# Patient Record
Sex: Female | Born: 1945 | Race: White | Hispanic: No | Smoking: Never smoker
Health system: Southern US, Community
[De-identification: ages and names within clinical notes are randomized; demographics above are authoritative.]

## PROBLEM LIST (undated history)

## (undated) DIAGNOSIS — R0989 Other specified symptoms and signs involving the circulatory and respiratory systems: Secondary | ICD-10-CM

## (undated) DIAGNOSIS — E7800 Pure hypercholesterolemia, unspecified: Secondary | ICD-10-CM

## (undated) DIAGNOSIS — M810 Age-related osteoporosis without current pathological fracture: Secondary | ICD-10-CM

## (undated) DIAGNOSIS — I10 Essential (primary) hypertension: Secondary | ICD-10-CM

## (undated) HISTORY — PX: OTHER SURGICAL HISTORY: SHX169

## (undated) HISTORY — DX: Pure hypercholesterolemia, unspecified: E78.00

## (undated) HISTORY — PX: TUBAL LIGATION: SHX77

---

## 2007-02-09 ENCOUNTER — Emergency Department (HOSPITAL_COMMUNITY): Admission: EM | Admit: 2007-02-09 | Discharge: 2007-02-09 | Payer: Self-pay | Admitting: Family Medicine

## 2011-07-07 DIAGNOSIS — E785 Hyperlipidemia, unspecified: Secondary | ICD-10-CM | POA: Insufficient documentation

## 2011-07-07 DIAGNOSIS — Z77120 Contact with and (suspected) exposure to mold (toxic): Secondary | ICD-10-CM | POA: Insufficient documentation

## 2011-11-09 DIAGNOSIS — R21 Rash and other nonspecific skin eruption: Secondary | ICD-10-CM | POA: Insufficient documentation

## 2011-11-09 DIAGNOSIS — R591 Generalized enlarged lymph nodes: Secondary | ICD-10-CM | POA: Insufficient documentation

## 2011-11-09 DIAGNOSIS — R040 Epistaxis: Secondary | ICD-10-CM | POA: Insufficient documentation

## 2012-04-22 ENCOUNTER — Encounter (HOSPITAL_COMMUNITY): Payer: Self-pay | Admitting: Emergency Medicine

## 2012-04-22 ENCOUNTER — Emergency Department (HOSPITAL_COMMUNITY)
Admission: EM | Admit: 2012-04-22 | Discharge: 2012-04-23 | Disposition: A | Discharge status: Home / Self Care | Payer: Medicare Other | Attending: Emergency Medicine | Admitting: Emergency Medicine

## 2012-04-22 DIAGNOSIS — Z7982 Long term (current) use of aspirin: Secondary | ICD-10-CM | POA: Insufficient documentation

## 2012-04-22 DIAGNOSIS — M542 Cervicalgia: Secondary | ICD-10-CM | POA: Insufficient documentation

## 2012-04-22 DIAGNOSIS — R220 Localized swelling, mass and lump, head: Secondary | ICD-10-CM | POA: Insufficient documentation

## 2012-04-22 DIAGNOSIS — Z79899 Other long term (current) drug therapy: Secondary | ICD-10-CM | POA: Insufficient documentation

## 2012-04-22 DIAGNOSIS — R062 Wheezing: Secondary | ICD-10-CM | POA: Insufficient documentation

## 2012-04-22 DIAGNOSIS — I10 Essential (primary) hypertension: Secondary | ICD-10-CM | POA: Insufficient documentation

## 2012-04-22 DIAGNOSIS — M810 Age-related osteoporosis without current pathological fracture: Secondary | ICD-10-CM | POA: Insufficient documentation

## 2012-04-22 DIAGNOSIS — E7800 Pure hypercholesterolemia, unspecified: Secondary | ICD-10-CM | POA: Insufficient documentation

## 2012-04-22 DIAGNOSIS — R0989 Other specified symptoms and signs involving the circulatory and respiratory systems: Secondary | ICD-10-CM | POA: Insufficient documentation

## 2012-04-22 HISTORY — DX: Age-related osteoporosis without current pathological fracture: M81.0

## 2012-04-22 HISTORY — DX: Essential (primary) hypertension: I10

## 2012-04-22 NOTE — ED Notes (Signed)
C/o distended L jugular vein x 1 week.  Reports L side of neck sore x 3 months.

## 2012-04-23 ENCOUNTER — Emergency Department (HOSPITAL_COMMUNITY): Payer: Medicare Other

## 2012-04-23 ENCOUNTER — Ambulatory Visit (HOSPITAL_COMMUNITY): Payer: Medicare Other

## 2012-04-23 LAB — POCT I-STAT, CHEM 8
BUN: 18 mg/dL (ref 6–23)
Hemoglobin: 13.9 g/dL (ref 12.0–15.0)
Sodium: 141 mEq/L (ref 135–145)
TCO2: 27 mmol/L (ref 0–100)

## 2012-04-23 MED ORDER — IOHEXOL 300 MG/ML  SOLN
100.0000 mL | Freq: Once | INTRAMUSCULAR | Status: AC | PRN
  Administered 2012-04-23: 100 mL via INTRAVENOUS

## 2012-04-23 NOTE — ED Notes (Signed)
Iv team here 

## 2012-04-23 NOTE — ED Provider Notes (Signed)
History     CSN: [id number]  Arrival date & time 04/22/12  2246   First MD Initiated Contact with Patient 04/23/12 0024      Chief Complaint  Patient presents with  . Neck Pain   HPI  History provided by the patient. Patient is a 66 year old female with history of hypertension, hypercholesterolemia and osteoporosis who presents with concerns for distended left jugular vein that began this past Monday he has been persistent for the past week. Patient states that she had never noticed this before but has had some recent history of pressure and tightness in the left side of her neck. She also reports recent history of lymphadenopathy of the neck that resolved over the past month. Patient states that during that time she was exposed to a toxic mold in her home and was seen by a lung specialist at [facility]. She denied any testing performed as she was asymptomatic at that time but was noted to have lymphadenopathy of the neck. Patient does report occasionally having some wheezing of her chest but denies any shortness of breath symptoms. Patient denies any other associated symptoms. She denies any severe neck pain this time. She denies any difficulties breathing or swallowing. Denies any strokelike symptoms. No headache, confusion, facial weakness, vision change, speech difficulty, weakness or numbness in extremities.    Past Medical History  Diagnosis Date  . Hypertension   . High cholesterol   . Osteoporosis     History reviewed. No pertinent past surgical history.  No family history on file.  History  Substance Use Topics  . Smoking status: Never Smoker   . Smokeless tobacco: Not on file  . Alcohol Use: Yes    OB History    Grav Para Term Preterm Abortions TAB SAB Ect Mult Living                  Review of Systems  Constitutional: Negative for fever and chills.  HENT: Positive for neck pain.   Eyes: Negative for visual disturbance.  Respiratory: Positive for wheezing. Negative  for cough and shortness of breath.   Cardiovascular: Negative for chest pain and leg swelling.  Musculoskeletal: Negative for back pain.  Neurological: Negative for dizziness, syncope, light-headedness and headaches.    Allergies  Review of patient's allergies indicates no known allergies.  Home Medications   Current Outpatient Rx  Name Route Sig Dispense Refill  . ASPIRIN EC 81 MG PO TBEC Oral Take 81 mg by mouth daily.    . ATORVASTATIN CALCIUM 10 MG PO TABS Oral Take 10 mg by mouth daily.    Marland Kitchen CALCIUM + D PO Oral Take 1 tablet by mouth daily.    . DESONIDE 0.05 % EX CREA Topical Apply 1 application topically daily.    Marland Kitchen DIPHENHYDRAMINE HCL 25 MG PO TABS Oral Take 25 mg by mouth at bedtime.    . OMEGA-3 FATTY ACIDS 1000 MG PO CAPS Oral Take 1 g by mouth daily.    Marland Kitchen LISINOPRIL-HYDROCHLOROTHIAZIDE 20-12.5 MG PO TABS Oral Take 1 tablet by mouth daily.    . ADULT MULTIVITAMIN W/MINERALS CH Oral Take 1 tablet by mouth daily.    Marland Kitchen OVER THE COUNTER MEDICATION Both Eyes Place 1 application into both eyes daily as needed. Rewetting eye drops as needed for dry eyes    . TACROLIMUS 0.1 % EX OINT Topical Apply 1 application topically 2 (two) times a week.      BP 153/100  Temp 98.3 F (36.8  C) (Oral)  Resp 18  SpO2 97%  Physical Exam  Nursing note and vitals reviewed. Constitutional: She is oriented to person, place, and time. She appears well-developed and well-nourished. No distress.  HENT:  Head: Normocephalic and atraumatic.  Eyes: Conjunctivae and EOM are normal. Pupils are equal, round, and reactive to light.  Neck: Carotid bruit is not present. No tracheal deviation present.       Left jugular vein distention. Very mild tenderness. No masses.  Cardiovascular: Normal rate and regular rhythm.   Pulmonary/Chest: Effort normal and breath sounds normal. No stridor. No respiratory distress. She has no wheezes. She has no rales.  Abdominal: Soft. There is no tenderness.    Musculoskeletal: Normal range of motion. She exhibits no edema and no tenderness.  Lymphadenopathy:    She has no cervical adenopathy.  Neurological: She is alert and oriented to person, place, and time. She has normal strength. No cranial nerve deficit or sensory deficit. Gait normal.  Skin: Skin is warm and dry.  Psychiatric: She has a normal mood and affect. Her behavior is normal.    ED Course  Procedures   Dg Chest 2 View  04/23/2012  *RADIOLOGY REPORT*  Clinical Data: 66 year old female with left-sided swelling.  CHEST - 2 VIEW  Comparison: None  Findings:  Upper limits normal heart size noted. There is no evidence of focal airspace disease, pulmonary edema, suspicious pulmonary nodule/mass, pleural effusion, or pneumothorax. No acute bony abnormalities are identified.  IMPRESSION: No evidence of active cardiopulmonary disease.  Original Report Authenticated By: Rosendo Gros, M.D.   Ct Soft Tissue Neck W Contrast  04/23/2012  *RADIOLOGY REPORT*  Clinical Data:  Distended left jugular vein for 1 week.  Soreness for 3 months.  CT NECK AND CHEST WITH CONTRAST  Technique:  Multidetector CT imaging of the neck and chest was performed using the standard protocol after bolus administration of intravenous contrast.  Contrast: OMNIPAQUE IOHEXOL 300 MG/ML  SOLN  Comparison:   None.  CT NECK  Findings:  Mucosal line prevertebral spaces appear intact. Cervical fat planes are not displaced.  Carotid and jugular vessels appear patent.  Asymmetry of the external jugular veins, with left larger than right.  No evidence of venous thrombosis.  Salivary glands are symmetrical.  No significant lymphadenopathy in the neck.  Cervical lymph nodes are not pathologically enlarged. Larynx appears intact.  No abnormal mass or fluid collection in the neck.  IMPRESSION: Asymmetric increased diameter of the left external jugular vein with respect to the right side.  No venous thrombosis or mass lesion demonstrated.   No significant lymphadenopathy.  CT CHEST  Findings: Normal heart size.  Normal caliber thoracic aorta. Superior vena cava, subclavian and jugular veins appear patent without evidence of venous thrombosis.  No significant mass or lymphadenopathy demonstrated in the mediastinum or supraclavicular region.  The esophagus is decompressed.  There is a small esophageal hiatal hernia.  No pleural effusion.  Respiratory motion artifact limits visualization of the lungs but there is no obvious focal pulmonary consolidation or interstitial disease.  Scattered emphysematous changes are present.  No pneumothorax.  Incidental note of a cyst in the dome of the liver measuring about 1.5 cm diameter.  IMPRESSION: No mass, lymphadenopathy, or acute process demonstrated in the chest.  Original Report Authenticated By: Marlon Pel, M.D.   Ct Chest W Contrast  04/23/2012  *RADIOLOGY REPORT*  Clinical Data:  Distended left jugular vein for 1 week.  Soreness for 3 months.  CT NECK AND CHEST WITH CONTRAST  Technique:  Multidetector CT imaging of the neck and chest was performed using the standard protocol after bolus administration of intravenous contrast.  Contrast: OMNIPAQUE IOHEXOL 300 MG/ML  SOLN  Comparison:   None.  CT NECK  Findings:  Mucosal line prevertebral spaces appear intact. Cervical fat planes are not displaced.  Carotid and jugular vessels appear patent.  Asymmetry of the external jugular veins, with left larger than right.  No evidence of venous thrombosis.  Salivary glands are symmetrical.  No significant lymphadenopathy in the neck.  Cervical lymph nodes are not pathologically enlarged. Larynx appears intact.  No abnormal mass or fluid collection in the neck.  IMPRESSION: Asymmetric increased diameter of the left external jugular vein with respect to the right side.  No venous thrombosis or mass lesion demonstrated.  No significant lymphadenopathy.  CT CHEST  Findings: Normal heart size.  Normal caliber  thoracic aorta. Superior vena cava, subclavian and jugular veins appear patent without evidence of venous thrombosis.  No significant mass or lymphadenopathy demonstrated in the mediastinum or supraclavicular region.  The esophagus is decompressed.  There is a small esophageal hiatal hernia.  No pleural effusion.  Respiratory motion artifact limits visualization of the lungs but there is no obvious focal pulmonary consolidation or interstitial disease.  Scattered emphysematous changes are present.  No pneumothorax.  Incidental note of a cyst in the dome of the liver measuring about 1.5 cm diameter.  IMPRESSION: No mass, lymphadenopathy, or acute process demonstrated in the chest.  Original Report Authenticated By: Marlon Pel, M.D.     1. Jugular venous distension (JVD)       MDM  12:45AM patient seen and evaluated. Patient well-appearing in no distress.   Patient was also seen and discussed with attending physician. Will obtain CT scans of neck and chest after consulting with the radiologist to rule out any kind of mass or compression causing dilation of left JV.  CT unremarkable. Will schedule outpatient Doppler per patient. Patient advised of findings. She remained asymptomatic. she is ready to return home to     [place], [place] 04/23/12 [phone]

## 2012-04-23 NOTE — ED Notes (Signed)
One attempt to insert an a-c iv.unsuccessful.  Iv team called

## 2012-04-23 NOTE — ED Notes (Signed)
PT TRANSPORTED TO XRAY 

## 2012-04-23 NOTE — ED Notes (Signed)
To ct

## 2012-04-23 NOTE — ED Notes (Signed)
The pt has had some lt carotid motion in her lt neck since Monday and she feel like it is pulsating.  Sometimes its uncomfortable other times it is not..  No pain at present

## 2012-04-24 ENCOUNTER — Ambulatory Visit (HOSPITAL_COMMUNITY)
Admission: RE | Admit: 2012-04-24 | Discharge: 2012-04-24 | Disposition: A | Discharge status: Home / Self Care | Payer: Medicare Other | Source: Ambulatory Visit | Attending: Emergency Medicine | Admitting: Emergency Medicine

## 2012-04-24 DIAGNOSIS — M542 Cervicalgia: Secondary | ICD-10-CM | POA: Insufficient documentation

## 2012-04-24 DIAGNOSIS — R0989 Other specified symptoms and signs involving the circulatory and respiratory systems: Secondary | ICD-10-CM

## 2012-04-24 DIAGNOSIS — I6529 Occlusion and stenosis of unspecified carotid artery: Secondary | ICD-10-CM | POA: Insufficient documentation

## 2012-04-24 NOTE — ED Provider Notes (Signed)
Medical screening examination/treatment/procedure(s) were conducted as a shared visit with non-physician practitioner(s) and myself.  I personally evaluated the patient during the encounter  Cyndra Numbers, MD 04/24/12 1454

## 2012-04-24 NOTE — Progress Notes (Signed)
VASCULAR LAB PRELIMINARY  PRELIMINARY  PRELIMINARY  PRELIMINARY  Carotid Dopplers completed.    Preliminary report:  No ICA stenosis.  Vertebral artery flow is antegrade.  Kendra Blackburn, 04/24/2012, 12:32 PM

## 2012-04-25 DIAGNOSIS — N644 Mastodynia: Secondary | ICD-10-CM | POA: Insufficient documentation

## 2012-04-29 DIAGNOSIS — I10 Essential (primary) hypertension: Secondary | ICD-10-CM | POA: Insufficient documentation

## 2012-05-27 DIAGNOSIS — I878 Other specified disorders of veins: Secondary | ICD-10-CM | POA: Insufficient documentation

## 2012-07-05 DIAGNOSIS — R12 Heartburn: Secondary | ICD-10-CM | POA: Insufficient documentation

## 2012-07-05 DIAGNOSIS — L309 Dermatitis, unspecified: Secondary | ICD-10-CM | POA: Insufficient documentation

## 2012-07-11 ENCOUNTER — Encounter (HOSPITAL_COMMUNITY): Payer: Self-pay | Admitting: *Deleted

## 2012-07-11 ENCOUNTER — Emergency Department (HOSPITAL_COMMUNITY)
Admission: EM | Admit: 2012-07-11 | Discharge: 2012-07-11 | Disposition: A | Discharge status: Home / Self Care | Payer: Medicare Other | Source: Home / Self Care | Attending: Family Medicine | Admitting: Family Medicine

## 2012-07-11 ENCOUNTER — Emergency Department (INDEPENDENT_AMBULATORY_CARE_PROVIDER_SITE_OTHER): Payer: Medicare Other

## 2012-07-11 DIAGNOSIS — S52599A Other fractures of lower end of unspecified radius, initial encounter for closed fracture: Secondary | ICD-10-CM

## 2012-07-11 DIAGNOSIS — S52509A Unspecified fracture of the lower end of unspecified radius, initial encounter for closed fracture: Secondary | ICD-10-CM

## 2012-07-11 HISTORY — DX: Other specified symptoms and signs involving the circulatory and respiratory systems: R09.89

## 2012-07-11 HISTORY — DX: Pure hypercholesterolemia, unspecified: E78.00

## 2012-07-11 MED ORDER — HYDROCODONE-ACETAMINOPHEN 5-325 MG PO TABS: 1.0000 | ORAL_TABLET | Freq: Four times a day (QID) | ORAL | Status: DC | PRN

## 2012-07-11 NOTE — ED Notes (Signed)
Reports starting to stand up while washing windows and lost balance, falling backward onto LUE.  C/O left wrist pain with slight numbness/tingling in all digits; all digits warm, pink, with prompt cap refill.  Has been applying ice.

## 2012-07-11 NOTE — ED Provider Notes (Signed)
History     CSN: [id number]  Arrival date & time 07/11/12  1839   First MD Initiated Contact with Patient 07/11/12 1904      Chief Complaint  Patient presents with  . Wrist Injury    (Consider location/radiation/quality/duration/timing/severity/associated sxs/prior treatment) Patient is a 66 y.o. female presenting with wrist injury. The history is provided by the patient.  Wrist Injury  The incident occurred 1 to 2 hours ago. The incident occurred at home. The injury mechanism was a fall. The pain is present in the left wrist. The quality of the pain is described as aching. The pain is mild. Associated symptoms comments: H/o osteoporosis. She reports no foreign bodies present. The symptoms are aggravated by movement, use and palpation.    Past Medical History  Diagnosis Date  . Hypertension   . Osteoporosis   . JVD (jugular venous distension)     being worked up at [facility] for JVD with neck and shoulder pain  . Hypercholesteremia     Past Surgical History  Procedure Date  . Tubal ligation     No family history on file.  History  Substance Use Topics  . Smoking status: Never Smoker   . Smokeless tobacco: Not on file  . Alcohol Use: Yes     occasional    OB History    Grav Para Term Preterm Abortions TAB SAB Ect Mult Living                  Review of Systems  Constitutional: Negative.   Musculoskeletal: Positive for joint swelling.  Skin: Negative.     Allergies  Review of patient's allergies indicates no known allergies.  Home Medications   Current Outpatient Rx  Name Route Sig Dispense Refill  . ASPIRIN EC 81 MG PO TBEC Oral Take 81 mg by mouth daily.    . ATORVASTATIN CALCIUM 10 MG PO TABS Oral Take 10 mg by mouth daily.    Marland Kitchen CALCIUM + D PO Oral Take 1 tablet by mouth daily.    . OMEGA-3 FATTY ACIDS 1000 MG PO CAPS Oral Take 1 g by mouth daily.    Marland Kitchen LISINOPRIL-HYDROCHLOROTHIAZIDE 20-12.5 MG PO TABS Oral Take 1 tablet by mouth daily.    . ADULT  MULTIVITAMIN W/MINERALS CH Oral Take 1 tablet by mouth daily.    . DESONIDE 0.05 % EX CREA Topical Apply 1 application topically daily.    Marland Kitchen DIPHENHYDRAMINE HCL 25 MG PO TABS Oral Take 25 mg by mouth at bedtime.    Marland Kitchen HYDROCODONE-ACETAMINOPHEN 5-325 MG PO TABS Oral Take 1 tablet by mouth every 6 (six) hours as needed for pain. 10 tablet 0  . OVER THE COUNTER MEDICATION Both Eyes Place 1 application into both eyes daily as needed. Rewetting eye drops as needed for dry eyes    . TACROLIMUS 0.1 % EX OINT Topical Apply 1 application topically 2 (two) times a week.      BP 121/62  Pulse 59  Temp 98.9 F (37.2 C) (Oral)  Resp 18  SpO2 96%  Physical Exam  Nursing note and vitals reviewed. Constitutional: She is oriented to person, place, and time. She appears well-developed and well-nourished.  Musculoskeletal: She exhibits tenderness.       Arms: Neurological: She is alert and oriented to person, place, and time.  Skin: Skin is warm and dry.    ED Course  Procedures (including critical care time)  Labs Reviewed - No data to display Dg Wrist Complete  Left  07/11/2012  *RADIOLOGY REPORT*  Clinical Data: Wrist injury after falling today.  LEFT WRIST - COMPLETE 3+ VIEW  Comparison: None.  Findings: There is an intra-articular fracture of the dorsal lip of the distal radius which is best on the lateral view.  This is mildly displaced.  The distal ulna appears intact.  There is no dislocation.  IMPRESSION: Mildly displaced intra-articular fracture involving the dorsal lip of the distal radius.   Original Report Authenticated By: Gerrianne Scale, M.D.      1. Fracture, radius, distal       MDM  X-rays reviewed and report per radiologist.         Linna Hoff, MD 07/11/12 2036

## 2012-07-11 NOTE — Progress Notes (Signed)
Orthopedic Tech Progress Note Patient Details:  Kendra Blackburn 07-11-1946 [id number]  Ortho Devices Type of Ortho Device: Sugartong splint;Arm foam sling Ortho Device/Splint Location: left arm Ortho Device/Splint Interventions: Application   Kendra Blackburn 07/11/2012, 8:53 PM

## 2012-07-29 DIAGNOSIS — R519 Headache, unspecified: Secondary | ICD-10-CM | POA: Insufficient documentation

## 2012-07-29 DIAGNOSIS — R51 Headache: Secondary | ICD-10-CM

## 2012-11-30 DIAGNOSIS — Z961 Presence of intraocular lens: Secondary | ICD-10-CM | POA: Insufficient documentation

## 2013-02-24 DIAGNOSIS — M8580 Other specified disorders of bone density and structure, unspecified site: Secondary | ICD-10-CM | POA: Insufficient documentation

## 2013-02-28 DIAGNOSIS — H02829 Cysts of unspecified eye, unspecified eyelid: Secondary | ICD-10-CM | POA: Insufficient documentation

## 2013-05-04 DIAGNOSIS — Z9889 Other specified postprocedural states: Secondary | ICD-10-CM | POA: Insufficient documentation

## 2014-03-05 ENCOUNTER — Emergency Department (INDEPENDENT_AMBULATORY_CARE_PROVIDER_SITE_OTHER)
Admission: EM | Admit: 2014-03-05 | Discharge: 2014-03-05 | Disposition: A | Discharge status: Home / Self Care | Payer: Medicare Other | Source: Home / Self Care | Attending: Emergency Medicine | Admitting: Emergency Medicine

## 2014-03-05 ENCOUNTER — Encounter (HOSPITAL_COMMUNITY): Payer: Self-pay | Admitting: Emergency Medicine

## 2014-03-05 DIAGNOSIS — L247 Irritant contact dermatitis due to plants, except food: Secondary | ICD-10-CM

## 2014-03-05 DIAGNOSIS — L255 Unspecified contact dermatitis due to plants, except food: Secondary | ICD-10-CM

## 2014-03-05 MED ORDER — RANITIDINE HCL 300 MG PO TABS: 300.0000 mg | ORAL_TABLET | Freq: Every day | ORAL | Status: DC

## 2014-03-05 MED ORDER — FEXOFENADINE HCL 180 MG PO TABS: 180.0000 mg | ORAL_TABLET | Freq: Every day | ORAL | Status: DC

## 2014-03-05 MED ORDER — PREDNISONE 20 MG PO TABS
ORAL_TABLET | ORAL | Status: AC
  Filled 2014-03-05: qty 3

## 2014-03-05 MED ORDER — PREDNISONE 20 MG PO TABS
60.0000 mg | ORAL_TABLET | Freq: Once | ORAL | Status: AC
  Administered 2014-03-05: 60 mg via ORAL

## 2014-03-05 MED ORDER — PREDNISONE 10 MG PO TABS: ORAL_TABLET | ORAL | Status: DC

## 2014-03-05 NOTE — Discharge Instructions (Signed)
Poison [place] Poison ivy is a inflammation of the skin (contact dermatitis) caused by touching the allergens on the leaves of the ivy plant following previous exposure to the plant. The rash usually appears 48 hours after exposure. The rash is usually bumps (papules) or blisters (vesicles) in a linear pattern. Depending on your own sensitivity, the rash may simply cause redness and itching, or it may also progress to blisters which may break open. These must be well cared for to prevent secondary bacterial (germ) infection, followed by scarring. Keep any open areas dry, clean, dressed, and covered with an antibacterial ointment if needed. The eyes may also get puffy. The puffiness is worst in the morning and gets better as the day progresses. This dermatitis usually heals without scarring, within 2 to 3 weeks without treatment. HOME CARE INSTRUCTIONS  Thoroughly wash with soap and water as soon as you have been exposed to poison ivy. You have about one half hour to remove the plant resin before it will cause the rash. This washing will destroy the oil or antigen on the skin that is causing, or will cause, the rash. Be sure to wash under your fingernails as any plant resin there will continue to spread the rash. Do not rub skin vigorously when washing affected area. Poison ivy cannot spread if no oil from the plant remains on your body. A rash that has progressed to weeping sores will not spread the rash unless you have not washed thoroughly. It is also important to wash any clothes you have been wearing as these may carry active allergens. The rash will return if you wear the unwashed clothing, even several days later. Avoidance of the plant in the future is the best measure. Poison ivy plant can be recognized by the number of leaves. Generally, poison ivy has three leaves with flowering branches on a single stem. Diphenhydramine may be purchased over the counter and used as needed for itching. Do not drive with  this medication if it makes you drowsy.Ask your caregiver about medication for children. SEEK MEDICAL CARE IF:  Open sores develop.  Redness spreads beyond area of rash.  You notice purulent (pus-like) discharge.  You have increased pain.  Other signs of infection develop (such as fever). Document Released: 09/04/2000 Document Revised: 11/30/2011 Document Reviewed: 07/24/2009 [facility] Patient Information 2014 [place], [place].  [facility]ison [facility] Poison oak is an inflammation of the skin (contact dermatitis). It is caused by contact with the allergens on the leaves of the oak (toxicodendron) plants. Depending on your sensitivity, the rash may consist simply of redness and itching, or it may also progress to blisters which may break open (rupture). These must be well cared for to prevent secondary germ (bacterial) infection as these infections can lead to scarring. The eyes may also get puffy. The puffiness is worst in the morning and gets better as the day progresses. Healing is best accomplished by keeping any open areas dry, clean, covered with a bandage, and covered with an antibacterial ointment if needed. Without secondary infection, this dermatitis usually heals without scarring within 2 to 3 weeks without treatment. HOME CARE INSTRUCTIONS When you have been exposed to poison oak, it is very important to thoroughly wash with soap and water as soon as the exposure has been discovered. You have about one half hour to remove the plant resin before it will cause the rash. This cleaning will quickly destroy the oil or antigen on the skin (the antigen is what causes the rash).  Wash aggressively under the fingernails as any plant resin still there will continue to spread the rash. Do not rub skin vigorously when washing affected area. Poison oak cannot spread if no oil from the plant remains on your body. Rash that has progressed to weeping sores (lesions) will not spread the rash unless you have not washed  thoroughly. It is also important to clean any clothes you have been wearing as they may carry active allergens which will spread the rash, even several days later. Avoidance of the plant in the future is the best measure. Poison oak plants can be recognized by the number of leaves. Generally, poison oak has three leaves with flowering branches on a single stem. Diphenhydramine may be purchased over the counter and used as needed for itching. Do not drive with this medication if it makes you drowsy. Ask your caregiver about medication for children. SEEK IMMEDIATE MEDICAL CARE IF:   Open areas of the rash develop.  You notice redness extending beyond the area of the rash.  There is a pus like discharge.  There is increased pain.  Other signs of infection develop (such as fever). Document Released: 03/14/2003 Document Revised: 11/30/2011 Document Reviewed: 07/24/2009 [facility] Patient Information 2014 [place], [place].

## 2014-03-05 NOTE — ED Provider Notes (Signed)
CSN: [id number]     Arrival date & time 03/05/14  [id number] History   First MD Initiated Contact with Patient 03/05/14 1007     No chief complaint on file.  (Consider location/radiation/quality/duration/timing/severity/associated sxs/prior Treatment) HPI Comments: 68 year old female presents complaining of poison oak. She was working in her yard yesterday when she accidentally grabbed a poison [place] plant. She says she is extremely allergic and has had severe reactions in the past. She currently has this rash on her arms, hands, trunk, and face. She has been putting over-the-counter Tecnu gel on it which helps slightly. She is requesting a prescription of prednisone because she says she always ends up having to take that when she gets poison ivy or poison oak. No systemic symptoms.   Past Medical History  Diagnosis Date  . Hypertension   . Osteoporosis   . JVD (jugular venous distension)     being worked up at [facility] for JVD with neck and shoulder pain  . Hypercholesteremia    Past Surgical History  Procedure Laterality Date  . Tubal ligation     No family history on file. History  Substance Use Topics  . Smoking status: Never Smoker   . Smokeless tobacco: Not on file  . Alcohol Use: Yes     Comment: occasional   OB History   Grav Para Term Preterm Abortions TAB SAB Ect Mult Living                 Review of Systems  Skin: Positive for rash.  All other systems reviewed and are negative.   Allergies  Review of patient's allergies indicates no known allergies.  Home Medications   Prior to Admission medications   Medication Sig Start Date End Date Taking? Authorizing Provider  aspirin EC 81 MG tablet Take 81 mg by mouth daily.    Historical Provider, MD  atorvastatin (LIPITOR) 10 MG tablet Take 10 mg by mouth daily.    Historical Provider, MD  Calcium Carbonate-Vitamin D (CALCIUM + D PO) Take 1 tablet by mouth daily.    Historical Provider, MD  desonide (DESOWEN) 0.05 % cream  Apply 1 application topically daily.    Historical Provider, MD  diphenhydrAMINE (BENADRYL) 25 MG tablet Take 25 mg by mouth at bedtime.    Historical Provider, MD  fexofenadine (ALLEGRA ALLERGY) 180 MG tablet Take 1 tablet (180 mg total) by mouth daily. 03/05/14   Graylon GoodZachary H Edra Riccardi, PA-C  fish oil-omega-3 fatty acids 1000 MG capsule Take 1 g by mouth daily.    Historical Provider, MD  HYDROcodone-acetaminophen (NORCO/VICODIN) 5-325 MG per tablet Take 1 tablet by mouth every 6 (six) hours as needed for pain. 07/11/12   Linna HoffJames D Kindl, MD  lisinopril-hydrochlorothiazide (PRINZIDE,ZESTORETIC) 20-12.5 MG per tablet Take 1 tablet by mouth daily.    Historical Provider, MD  Multiple Vitamin (MULTIVITAMIN WITH MINERALS) TABS Take 1 tablet by mouth daily.    Historical Provider, MD  OVER THE COUNTER MEDICATION Place 1 application into both eyes daily as needed. Rewetting eye drops as needed for dry eyes    Historical Provider, MD  predniSONE (DELTASONE) 10 MG tablet 4 tabs PO QD for 4 days; 3 tabs PO QD for 3 days; 2 tabs PO QD for 2 days; 1 tab PO QD for 1 day 03/05/14   Graylon GoodZachary H Havard Radigan, PA-C  ranitidine (ZANTAC) 300 MG tablet Take 1 tablet (300 mg total) by mouth at bedtime. 03/05/14   Adrian BlackwaterZachary H Nataniel Gasper, PA-C  tacrolimus (PROTOPIC) 0.1 %  ointment Apply 1 application topically 2 (two) times a week.    Historical Provider, MD   BP 150/93  Pulse 60  Temp(Src) 98.8 F (37.1 C) (Oral)  Resp 18  SpO2 100% Physical Exam  Nursing note and vitals reviewed. Constitutional: She is oriented to person, place, and time. Vital signs are normal. She appears well-developed and well-nourished. No distress.  HENT:  Head: Normocephalic and atraumatic.  Pulmonary/Chest: Effort normal. No respiratory distress.  Neurological: She is alert and oriented to person, place, and time. She has normal strength. Coordination normal.  Skin: Skin is warm and dry. Rash noted. Rash is vesicular (erythematous, vesicular rash in linear  distribution on the bilateral upper extremities, trunk, and face). She is not diaphoretic.  Psychiatric: She has a normal mood and affect. Judgment normal.    ED Course  Procedures (including critical care time) Labs Review Labs Reviewed - No data to display  Imaging Review No results found.   MDM   1. Irritant contact dermatitis due to plant    Consistent with poison ivy or poison oak. Treat with prednisone, antihistamines, H2 blocker. Followup when necessary   Meds ordered this encounter  Medications  . predniSONE (DELTASONE) tablet 60 mg    Sig:   . ranitidine (ZANTAC) 300 MG tablet    Sig: Take 1 tablet (300 mg total) by mouth at bedtime.    Dispense:  14 tablet    Refill:  0    Order Specific Question:  Supervising Provider    Answer:  Lorenz CoasterKELLER, DAVID C [id number]]  . fexofenadine (ALLEGRA ALLERGY) 180 MG tablet    Sig: Take 1 tablet (180 mg total) by mouth daily.    Dispense:  14 tablet    Refill:  0    Order Specific Question:  Supervising Provider    Answer:  Lorenz CoasterKELLER, DAVID C [id number]]  . predniSONE (DELTASONE) 10 MG tablet    Sig: 4 tabs PO QD for 4 days; 3 tabs PO QD for 3 days; 2 tabs PO QD for 2 days; 1 tab PO QD for 1 day    Dispense:  30 tablet    Refill:  0    Order Specific Question:  Supervising Provider    Answer:  Lorenz CoasterKELLER, DAVID C [[id number]]       Graylon GoodZachary H Rasul Decola, PA-C 03/05/14 1030

## 2014-03-05 NOTE — ED Notes (Signed)
Patient c/o rash from poison oak on various locations on her body.  Has been using OTC steroid cream and gel. Rash is raised and red and very itchy. Patient is alert and oriented and in no acute distress.

## 2014-03-07 NOTE — ED Provider Notes (Signed)
Medical screening examination/treatment/procedure(s) were performed by non-physician practitioner and as supervising physician I was immediately available for consultation/collaboration.  Ulus Hazen, M.D.   Eder Macek C Ani Deoliveira, MD 03/07/14 1324 

## 2014-05-12 DIAGNOSIS — Z124 Encounter for screening for malignant neoplasm of cervix: Secondary | ICD-10-CM | POA: Insufficient documentation

## 2014-05-12 DIAGNOSIS — Z780 Asymptomatic menopausal state: Secondary | ICD-10-CM | POA: Insufficient documentation

## 2015-05-14 DIAGNOSIS — R922 Inconclusive mammogram: Secondary | ICD-10-CM | POA: Insufficient documentation

## 2016-08-17 ENCOUNTER — Encounter (HOSPITAL_COMMUNITY): Payer: Self-pay | Admitting: Emergency Medicine

## 2016-08-17 ENCOUNTER — Ambulatory Visit (HOSPITAL_COMMUNITY)
Admission: EM | Admit: 2016-08-17 | Discharge: 2016-08-17 | Disposition: A | Discharge status: Home / Self Care | Payer: Medicare Other | Attending: Emergency Medicine | Admitting: Emergency Medicine

## 2016-08-17 DIAGNOSIS — S91209A Unspecified open wound of unspecified toe(s) with damage to nail, initial encounter: Secondary | ICD-10-CM | POA: Diagnosis not present

## 2016-08-17 MED ORDER — LIDOCAINE HCL 2 % IJ SOLN
INTRAMUSCULAR | Status: AC
  Filled 2016-08-17: qty 20

## 2016-08-17 NOTE — Discharge Instructions (Signed)
We finished removing the toenail. The toe in warm soapy water 3 times a day for the next 2 days. Apply Vaseline and rapid for the next 2 days. After that, you can leave it uncovered. You should be okay for yoga on Friday. Follow-up as needed.

## 2016-08-17 NOTE — ED Notes (Signed)
Bacitracin applied to nail bed and area dressed with gauze and coban.

## 2016-08-17 NOTE — ED Provider Notes (Signed)
[facility]    CSN: [id number] Arrival date & time: 08/17/16  1905     History   Chief Complaint Chief Complaint  Patient presents with  . Toe Injury    HPI Kendra Blackburn is a 70 y.o. female.   HPI  She is a 70 year old woman here for evaluation of left great toenail injury. Her left great toenail got caught on the shoe and with cough. She states it is still attached at the base.  Past Medical History:  Diagnosis Date  . Hypercholesteremia   . Hypertension   . JVD (jugular venous distension)    being worked up at [facility] for JVD with neck and shoulder pain  . Osteoporosis     There are no active problems to display for this patient.   Past Surgical History:  Procedure Laterality Date  . TUBAL LIGATION      OB History    No data available       Home Medications    Prior to Admission medications   Medication Sig Start Date End Date Taking? Authorizing Provider  aspirin EC 81 MG tablet Take 81 mg by mouth daily.    Historical Provider, MD  atorvastatin (LIPITOR) 10 MG tablet Take 10 mg by mouth daily.    Historical Provider, MD  Calcium Carbonate-Vitamin D (CALCIUM + D PO) Take 1 tablet by mouth daily.    Historical Provider, MD  desonide (DESOWEN) 0.05 % cream Apply 1 application topically daily.    Historical Provider, MD  diphenhydrAMINE (BENADRYL) 25 MG tablet Take 25 mg by mouth at bedtime.    Historical Provider, MD  fexofenadine (ALLEGRA ALLERGY) 180 MG tablet Take 1 tablet (180 mg total) by mouth daily. 03/05/14   Graylon GoodZachary H Baker, PA-C  fish oil-omega-3 fatty acids 1000 MG capsule Take 1 g by mouth daily.    Historical Provider, MD  HYDROcodone-acetaminophen (NORCO/VICODIN) 5-325 MG per tablet Take 1 tablet by mouth every 6 (six) hours as needed for pain. 07/11/12   Linna HoffJames D Kindl, MD  lisinopril-hydrochlorothiazide (PRINZIDE,ZESTORETIC) 20-12.5 MG per tablet Take 1 tablet by mouth daily.    Historical Provider, MD  Multiple Vitamin  (MULTIVITAMIN WITH MINERALS) TABS Take 1 tablet by mouth daily.    Historical Provider, MD  OVER THE COUNTER MEDICATION Place 1 application into both eyes daily as needed. Rewetting eye drops as needed for dry eyes    Historical Provider, MD  predniSONE (DELTASONE) 10 MG tablet 4 tabs PO QD for 4 days; 3 tabs PO QD for 3 days; 2 tabs PO QD for 2 days; 1 tab PO QD for 1 day 03/05/14   Graylon GoodZachary H Baker, PA-C  ranitidine (ZANTAC) 300 MG tablet Take 1 tablet (300 mg total) by mouth at bedtime. 03/05/14   Adrian BlackwaterZachary H Baker, PA-C  tacrolimus (PROTOPIC) 0.1 % ointment Apply 1 application topically 2 (two) times a week.    Historical Provider, MD    Family History No family history on file.  Social History Social History  Substance Use Topics  . Smoking status: Never Smoker  . Smokeless tobacco: Never Used  . Alcohol use Yes     Comment: occasional     Allergies   Patient has no known allergies.   Review of Systems Review of Systems As in history of present illness  Physical Exam Triage Vital Signs ED Triage Vitals  Enc Vitals Group     BP 08/17/16 2025 145/76     Pulse Rate 08/17/16 2025 Marland Kitchen(!)  54     Resp 08/17/16 2025 16     Temp 08/17/16 2025 97.8 F (36.6 C)     Temp Source 08/17/16 2025 Oral     SpO2 08/17/16 2025 100 %     Weight 08/17/16 2030 162 lb (73.5 kg)     Height 08/17/16 2030 5\' 8"  (1.727 m)     Head Circumference --      Peak Flow --      Pain Score 08/17/16 2031 0     Pain Loc --      Pain Edu? --      Excl. in GC? --    No data found.   Updated Vital Signs BP 145/76 (BP Location: Left Arm)   Pulse (!) 54   Temp 97.8 F (36.6 C) (Oral)   Resp 16   Ht 5\' 8"  (1.727 m)   Wt 162 lb (73.5 kg)   SpO2 100%   BMI 24.63 kg/m   Visual Acuity Right Eye Distance:   Left Eye Distance:   Bilateral Distance:    Right Eye Near:   Left Eye Near:    Bilateral Near:     Physical Exam  Constitutional: She is oriented to person, place, and time. She appears  well-developed and well-nourished. No distress.  Cardiovascular: Normal rate.   Pulmonary/Chest: Effort normal.  Neurological: She is alert and oriented to person, place, and time.  Skin:  Left great toe: Toenail is completely avulsed. It is loosely attached at the cuticle.     UC Treatments / Results  Labs (all labs ordered are listed, but only abnormal results are displayed) Labs Reviewed - No data to display  EKG  EKG Interpretation None       Radiology No results found.  Procedures .Nail Removal Date/Time: 08/17/2016 9:04 PM Performed by: Charm RingsHONIG, Rhealyn Cullen J Authorized by: Charm RingsHONIG, Olivea Sonnen J   Consent:    Consent obtained:  Verbal   Alternatives discussed:  No treatment Location:    Foot:  L big toe Pre-procedure details:    Skin preparation:  Betadine Anesthesia (see MAR for exact dosages):    Anesthesia method:  Nerve block   Block anesthetic:  Lidocaine 2% w/o epi   Block outcome:  Anesthesia achieved Nail Removal:    Nail removed:  Complete Post-procedure details:    Dressing:  Antibiotic ointment and 4x4 sterile gauze   Patient tolerance of procedure:  Tolerated well, no immediate complications   (including critical care time)  Medications Ordered in UC Medications - No data to display   Initial Impression / Assessment and Plan / UC Course  I have reviewed the triage vital signs and the nursing notes.  Pertinent labs & imaging results that were available during my care of the patient were reviewed by me and considered in my medical decision making (see chart for details).  Clinical Course     Toenail removed. Wound care instructions given. Follow-up as needed.  Final Clinical Impressions(s) / UC Diagnoses   Final diagnoses:  Avulsion of toenail of left foot    New Prescriptions New Prescriptions   No medications on file     Charm RingsErin J Isabellah Sobocinski, MD 08/17/16 2104

## 2016-08-17 NOTE — ED Triage Notes (Signed)
PT caught her left great toenail on the back of her sandal and ripped up toenail. Toenail is still attached at base. Bleeding is controlled without pressure.

## 2017-11-24 ENCOUNTER — Encounter: Payer: Self-pay | Admitting: Podiatry

## 2017-11-24 ENCOUNTER — Ambulatory Visit: Payer: Medicare Other | Admitting: Podiatry

## 2017-11-24 DIAGNOSIS — L603 Nail dystrophy: Secondary | ICD-10-CM | POA: Diagnosis not present

## 2017-11-24 MED ORDER — HYDROCODONE-ACETAMINOPHEN 5-325 MG PO TABS: 1.0000 | ORAL_TABLET | Freq: Four times a day (QID) | ORAL | 0 refills | Status: DC | PRN

## 2017-11-24 MED ORDER — ALPRAZOLAM 0.25 MG PO TABS: 0.2500 mg | ORAL_TABLET | Freq: Two times a day (BID) | ORAL | 0 refills | Status: DC | PRN

## 2017-11-24 NOTE — Progress Notes (Signed)
   Subjective:    Patient ID: Kendra Blackburn, female    DOB: 03/27/1946, 72 y.o.   MRN: [id number]  HPI    Review of Systems     Objective:   Physical Exam        Assessment & Plan:

## 2017-11-28 NOTE — Progress Notes (Signed)
   Subjective: 72 year old female presenting today as a new patient with a chief complaint of a thickened, discolored right great toenail that has been present for several weeks. She reports associated tenderness to the nail which is exacerbated by wearing shoes. She had her left great toenail removed last year secondary to an injury. She is nervous about having this toenail removed. Patient is here for further evaluation and treatment.   Past Medical History:  Diagnosis Date  . Hypercholesteremia   . Hypertension   . JVD (jugular venous distension)    being worked up at [facility] for JVD with neck and shoulder pain  . Osteoporosis     Objective: Physical Exam General: The patient is alert and oriented x3 in no acute distress.  Dermatology: Hyperkeratotic, discolored, thickened, onychodystrophy of the right great toenail. Skin is warm, dry and supple bilateral lower extremities. Negative for open lesions or macerations.  Vascular: Palpable pedal pulses bilaterally. No edema or erythema noted. Capillary refill within normal limits.  Neurological: Epicritic and protective threshold grossly intact bilaterally.   Musculoskeletal Exam: Range of motion within normal limits to all pedal and ankle joints bilateral. Muscle strength 5/5 in all groups bilateral.   Assessment: #1 Painful dystrophic nail right great toe  Plan of Care:  #1 Patient was evaluated. #2 Patient is very nervous/anxious about nail avulsion procedure.  #3 Prescription for Xanax 0.25 mg #2 and Vicodin 5/325 mg #10 provided to patient to take prior to next appointment.  #4 Return to clinic in one week for total temporary nail avulsion procedure of the right great toenail.    Felecia ShellingBrent M. Masha Orbach, DPM [facility]  Dr. Felecia ShellingBrent M. Mehgan Santmyer, DPM    [address]                                        [place], [place] [address]                Office [phone]) [phone]  Fax [phone]

## 2017-12-01 ENCOUNTER — Encounter: Payer: Self-pay | Admitting: Podiatry

## 2017-12-01 ENCOUNTER — Ambulatory Visit: Payer: Medicare Other | Admitting: Podiatry

## 2017-12-01 DIAGNOSIS — L603 Nail dystrophy: Secondary | ICD-10-CM

## 2017-12-01 NOTE — Patient Instructions (Signed)

## 2017-12-02 NOTE — Progress Notes (Signed)
   Subjective: 72 year old female presenting today with a chief complaint of a thickened, discolored right great toenail that has been present for several weeks. She is here for a total temporary nail avulsion procedure and states she took a half Xanax 0.25 mg tablet prior to arrival because she is nervous. Patient is here for further evaluation and treatment.   Past Medical History:  Diagnosis Date  . Hypercholesteremia   . Hypertension   . JVD (jugular venous distension)    being worked up at [facility] for JVD with neck and shoulder pain  . Osteoporosis     Objective:  General: Well developed, nourished, in no acute distress, alert and oriented x3   Dermatology: Hyperkeratotic, discolored, thickened, onychodystrophy of the right great toenail. Skin is warm, dry and supple bilateral lower extremities. Negative for open lesions or macerations.  Vascular: Dorsalis Pedis artery and Posterior Tibial artery pedal pulses palpable. No lower extremity edema noted.   Neruologic: Grossly intact via light touch bilateral.  Musculoskeletal: Muscular strength within normal limits in all groups bilateral. Normal range of motion noted to all pedal and ankle joints.   Assesement: #1 Dystrophic, painful nail of the right great toe  Plan of Care:  1. Patient evaluated.  2. Discussed treatment alternatives and plan of care. Explained nail avulsion procedure and post procedure course to patient. 3. Patient opted for total temporary nail avulsion.  4. Prior to procedure, local anesthesia infiltration utilized using 3 ml of a 50:50 mixture of 2% plain lidocaine and 0.5% plain marcaine in a normal hallux block fashion and a betadine prep performed.  5. Light dressing applied. 6. Return to clinic in 2 weeks.   Felecia ShellingBrent M. Maydelin Deming, DPM [facility]  Dr. Felecia ShellingBrent M. Penelopi Mikrut, DPM    [address]                                        [place], [place] [address]                Office [phone]    Fax [phone]

## 2017-12-15 ENCOUNTER — Ambulatory Visit: Payer: Medicare Other | Admitting: Podiatry

## 2021-01-22 ENCOUNTER — Ambulatory Visit (HOSPITAL_COMMUNITY)
Admission: EM | Admit: 2021-01-22 | Discharge: 2021-01-22 | Disposition: A | Discharge status: Home / Self Care | Payer: Medicare PPO

## 2021-01-22 ENCOUNTER — Other Ambulatory Visit: Payer: Self-pay

## 2021-01-22 ENCOUNTER — Emergency Department (HOSPITAL_COMMUNITY): Payer: Medicare PPO

## 2021-01-22 ENCOUNTER — Emergency Department (HOSPITAL_COMMUNITY)
Admission: EM | Admit: 2021-01-22 | Discharge: 2021-01-23 | Disposition: A | Discharge status: Home / Self Care | Payer: Medicare PPO | Attending: Emergency Medicine | Admitting: Emergency Medicine

## 2021-01-22 DIAGNOSIS — Y992 Volunteer activity: Secondary | ICD-10-CM | POA: Insufficient documentation

## 2021-01-22 DIAGNOSIS — Z23 Encounter for immunization: Secondary | ICD-10-CM | POA: Insufficient documentation

## 2021-01-22 DIAGNOSIS — W01198A Fall on same level from slipping, tripping and stumbling with subsequent striking against other object, initial encounter: Secondary | ICD-10-CM | POA: Diagnosis not present

## 2021-01-22 DIAGNOSIS — S0081XA Abrasion of other part of head, initial encounter: Secondary | ICD-10-CM

## 2021-01-22 DIAGNOSIS — Z79899 Other long term (current) drug therapy: Secondary | ICD-10-CM | POA: Diagnosis not present

## 2021-01-22 DIAGNOSIS — Z7982 Long term (current) use of aspirin: Secondary | ICD-10-CM | POA: Diagnosis not present

## 2021-01-22 DIAGNOSIS — S8391XA Sprain of unspecified site of right knee, initial encounter: Secondary | ICD-10-CM | POA: Diagnosis not present

## 2021-01-22 DIAGNOSIS — S93501A Unspecified sprain of right great toe, initial encounter: Secondary | ICD-10-CM | POA: Diagnosis not present

## 2021-01-22 DIAGNOSIS — Y9222 Religious institution as the place of occurrence of the external cause: Secondary | ICD-10-CM | POA: Insufficient documentation

## 2021-01-22 DIAGNOSIS — S060X0A Concussion without loss of consciousness, initial encounter: Secondary | ICD-10-CM | POA: Insufficient documentation

## 2021-01-22 DIAGNOSIS — I10 Essential (primary) hypertension: Secondary | ICD-10-CM | POA: Diagnosis not present

## 2021-01-22 DIAGNOSIS — W19XXXA Unspecified fall, initial encounter: Secondary | ICD-10-CM

## 2021-01-22 DIAGNOSIS — S0990XA Unspecified injury of head, initial encounter: Secondary | ICD-10-CM | POA: Diagnosis present

## 2021-01-22 NOTE — ED Provider Notes (Signed)
Emergency Medicine Provider Triage Evaluation Note  Kendra Blackburn 75 y.o. female was evaluated in triage.  Pt complains of fall that occurred about 2 hours prior to ED arrival.  Patient reports she was walking to church and tripped over the forklift.  This caused her to fall forward, landing on her right knee and hitting her face.  She does not think she had any LOC.  She is on aspirin.  No other blood thinners.  She reports pain, swelling noted to right knee since then.  She has had difficulty ambulating bearing weight on it.  She also reports pain to her face.  No vision changes, nausea/vomiting, numbness/weakness.  She did not have any preceding chest pain or dizziness.  Review of Systems  Positive: Headache, facial pain, right knee pain Negative: Nausea/vomiting, numbness/weakness  Physical Exam  BP 134/82   Pulse 70   Temp 98.2 F (36.8 C) (Oral)   Resp 18   Ht 5\' 4"  (1.626 m)   Wt 65.8 kg   SpO2 100%   BMI 24.89 kg/m  Gen:   Awake, no distress   HEENT:  Hematoma noted to left forehead.  Scattered abrasions noted to face.  Tenderness palpation in the nasal bridge extending into the zygomatic arch, particularly on the left.  EOMs intact without any difficulty. Resp:  Normal effort  Cardiac:  Normal rate  Abd:   Nondistended, nontender  MSK:   Right knee with tenderness palpation, overlying soft tissue swelling.  Limited range of motion secondary to pain. Neuro:  Speech clear.  5/5 strength bilateral upper and lower extremities.  Cranial nerves III through XII are intact.  Medical Decision Making  Medically screening exam initiated at 6:06 PM  Appropriate orders placed.  Kendra Blackburn was informed that the remainder of the evaluation will be completed by another provider, this initial triage assessment does not replace that evaluation, and the importance of remaining in the ED until their evaluation is complete.   Clinical Impression  Fall   Portions of this note were  generated with Dragon dictation software. Dictation errors may occur despite best attempts at proofreading.     Donne Anon, PA-C 01/22/21 03/24/21    Merrily Brittle, MD 01/22/21 2041

## 2021-01-22 NOTE — ED Triage Notes (Signed)
Pt arrives to ED after mechanical fall over part of a forklift, now has pain and swelling to R knee, abrasions to hands, hematoma to L forehead. Denies LOC or blood thinners.

## 2021-01-22 NOTE — ED Triage Notes (Signed)
Pt presents with facial abrasions, knot on forehead, and right knee swelling after fall earlier today. Denies any LOC. Pt states is on aspirin.   Patient is being discharged from the Urgent Care and sent to the Emergency Department via wheelchair with staff. Per Marlowe Shores, PA, patient is in need of higher level of care due to facial injuries and need for imagining. Patient is aware and verbalizes understanding of plan of care.  Vitals:   01/22/21 1738  BP: (!) 143/86  Pulse: 71  Resp: 16  Temp: 98.1 F (36.7 C)  SpO2: 96%

## 2021-01-23 MED ORDER — BACITRACIN ZINC 500 UNIT/GM EX OINT
TOPICAL_OINTMENT | Freq: Two times a day (BID) | CUTANEOUS | Status: DC
  Administered 2021-01-23: 1 via TOPICAL

## 2021-01-23 MED ORDER — TETANUS-DIPHTH-ACELL PERTUSSIS 5-2.5-18.5 LF-MCG/0.5 IM SUSY
0.5000 mL | PREFILLED_SYRINGE | Freq: Once | INTRAMUSCULAR | Status: AC
  Administered 2021-01-23: 0.5 mL via INTRAMUSCULAR
  Filled 2021-01-23: qty 0.5

## 2021-01-23 NOTE — ED Provider Notes (Signed)
[facility] EMERGENCY DEPARTMENT Provider Note   CSN: [id number] Arrival date & time: 01/22/21  1757     History Chief Complaint  Patient presents with  . Fall    Kendra Blackburn is a 75 y.o. female.  The history is provided by the patient.  Fall This is a new problem. The problem occurs constantly. The problem has not changed since onset.Pertinent negatives include no chest pain and no abdominal pain. Nothing aggravates the symptoms. Nothing relieves the symptoms.  Patient presents after a fall.  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  It occurred about 2 hours prior to arrival.  She hit her head and her right knee.  No LOC.  No anticoagulation, but does take aspirin She reports mild headache and right knee and right foot pain     Past Medical History:  Diagnosis Date  . Hypercholesteremia   . Hypertension   . JVD (jugular venous distension)    being worked up at [facility] for JVD with neck and shoulder pain  . Osteoporosis     Patient Active Problem List   Diagnosis Date Noted  . Dense breast tissue 05/14/2015  . Asymptomatic menopausal state 05/12/2014  . Screening for cervical cancer 05/12/2014  . History of breast lump/mass excision 05/04/2013  . Epidermal inclusion cyst of eyelid 02/28/2013  . Osteopenia 02/24/2013  . Pseudophakia, right eye 11/30/2012  . Headache 07/29/2012  . Dermatitis of external ear 07/05/2012  . Heartburn 07/05/2012  . Increased venous pressure 05/27/2012  . Hypertension 04/29/2012  . Breast pain, left 04/25/2012  . Epistaxis 11/09/2011  . Lymphadenopathy 11/09/2011  . Rash 11/09/2011  . Hyperlipidemia, unspecified 07/07/2011  . Mold exposure 07/07/2011    Past Surgical History:  Procedure Laterality Date  . TUBAL LIGATION       OB History   No obstetric history on file.     No family history on file.  Social History   Tobacco Use  . Smoking  status: Never Smoker  . Smokeless tobacco: Never Used  Substance Use Topics  . Alcohol use: Yes    Comment: occasional  . Drug use: No    Home Medications Prior to Admission medications   Medication Sig Start Date End Date Taking? Authorizing Provider  ALPRAZolam (XANAX) 0.25 MG tablet Take 1 tablet (0.25 mg total) by mouth 2 (two) times daily as needed for anxiety. 11/24/17   Felecia Shelling, DPM  aspirin EC 81 MG tablet Take 81 mg by mouth daily.    [provider]  atorvastatin (LIPITOR) 10 MG tablet Take 10 mg by mouth daily.    [provider]  CALCIUM-VITAMIN D PO Take by mouth.    [provider]  fish oil-omega-3 fatty acids 1000 MG capsule Take 1 g by mouth daily.    [provider]  HYDROcodone-acetaminophen (NORCO/VICODIN) 5-325 MG tablet Take 1 tablet by mouth every 6 (six) hours as needed for moderate pain. 11/24/17   Felecia Shelling, DPM  lisinopril-hydrochlorothiazide (PRINZIDE,ZESTORETIC) 20-12.5 MG per tablet Take 1 tablet by mouth daily.    [provider]  Multiple Vitamin (MULTIVITAMIN WITH MINERALS) TABS Take 1 tablet by mouth daily.    [provider]  tacrolimus (PROTOPIC) 0.1 % ointment Apply 1 application topically 2 (two) times a week.    [provider]    Allergies    Patient has no known allergies.  Review of Systems   Review of Systems  Constitutional: Negative for fever.  Eyes: Negative for visual disturbance.  Cardiovascular: Negative for chest pain.  Gastrointestinal: Negative for abdominal pain.  Musculoskeletal: Positive for arthralgias, joint swelling and neck pain.  Skin: Positive for wound.  All other systems reviewed and are negative.   Physical Exam Updated Vital Signs BP 129/73   Pulse 63   Temp 97.9 F (36.6 C)   Resp 18   SpO2 95%   Physical Exam CONSTITUTIONAL: Elderly, no acute distress HEAD: Hematoma and abrasion noted to left forehead.   EYES: EOMI/PERRL, no evidence  of eye trauma ENMT: Mucous membranes moist, abrasion to nose, abrasions to face.  Midface stable, no dental injury.  No trismus, no malocclusion NECK: supple no meningeal signs SPINE/BACK:entire spine nontender, cervical paraspinal tenderness CV: S1/S2 noted, no murmurs/rubs/gallops noted LUNGS: Lungs are clear to auscultation bilaterally, no apparent distress ABDOMEN: soft, nontender, no rebound or guarding, bowel sounds noted throughout abdomen GU:no cva tenderness NEURO: Pt is awake/alert/appropriate, moves all extremitiesx4.  No facial droop.   EXTREMITIES: pulses normal/equal, full ROM, tenderness and swelling noted to right knee.  Mild bruising noted to right great toe without deformity or significant tenderness. Scattered abrasions to extremities. All other extremities/joints palpated/ranged and nontender SKIN: warm, color normal PSYCH: no abnormalities of mood noted, alert and oriented to situation  ED Results / Procedures / Treatments   Labs (all labs ordered are listed, but only abnormal results are displayed) Labs Reviewed - No data to display  EKG None  Radiology CT Head Wo Contrast  Result Date: 01/22/2021 CLINICAL DATA:  Larey Seat, facial trauma EXAM: CT HEAD WITHOUT CONTRAST TECHNIQUE: Contiguous axial images were obtained from the base of the skull through the vertex without intravenous contrast. COMPARISON:  None. FINDINGS: Brain: No acute infarct or hemorrhage. Lateral ventricles and midline structures are unremarkable. No acute extra-axial fluid collections. No mass effect. Vascular: No hyperdense vessel or unexpected calcification. Skull: Small left frontal scalp hematoma. No underlying fracture. The remainder of the calvarium is unremarkable. Sinuses/Orbits: No acute finding. Other: None. IMPRESSION: 1. Left frontal scalp hematoma.  No underlying fracture. 2. No acute intracranial process. Electronically Signed   By: Sharlet Salina M.D.   On: 01/22/2021 19:04   CT Cervical  Spine Wo Contrast  Result Date: 01/22/2021 CLINICAL DATA:  Neck trauma, fell EXAM: CT CERVICAL SPINE WITHOUT CONTRAST TECHNIQUE: Multidetector CT imaging of the cervical spine was performed without intravenous contrast. Multiplanar CT image reconstructions were also generated. COMPARISON:  None. FINDINGS: Alignment: Alignment is anatomic. Skull base and vertebrae: No acute fracture. No primary bone lesion or focal pathologic process. Soft tissues and spinal canal: No prevertebral fluid or swelling. No visible canal hematoma. Disc levels: Multilevel cervical spondylosis most pronounced at C3-4, C5-6, and C6-7. Multilevel facet hypertrophy, greatest at C3-4 and C4-5 eccentric to the left. There is bony fusion across the left C4-5 facet. Upper chest: Airway is patent.  Lung apices are clear. Other: Reconstructed images demonstrate no additional findings. IMPRESSION: 1. No acute cervical spine fracture. 2. Multilevel cervical degenerative changes as above. Electronically Signed   By: Sharlet Salina M.D.   On: 01/22/2021 19:10   DG Knee Complete 4 Views Right  Result Date: 01/22/2021 CLINICAL DATA:  Fall EXAM: RIGHT KNEE - COMPLETE 4+ VIEW COMPARISON:  None. FINDINGS: no definitive fracture or malalignment. Mild tricompartment arthritis. No significant knee effusion. Prominent soft tissue swelling over the anterior knee. IMPRESSION: No definite acute osseous abnormality.  Electronically Signed   By: Jasmine Pang M.D.   On: 01/22/2021 18:53   CT Maxillofacial Wo Contrast  Result Date: 01/22/2021 CLINICAL DATA:  Facial trauma, fell EXAM: CT MAXILLOFACIAL WITHOUT CONTRAST TECHNIQUE: Multidetector CT imaging of the maxillofacial structures was performed. Multiplanar CT image reconstructions were also generated. COMPARISON:  None. FINDINGS: Osseous: No fracture or mandibular dislocation. No destructive process. Orbits: Negative. No traumatic or inflammatory finding. Sinuses: Clear. Soft tissues: Left frontal scalp  hematoma. Remaining soft tissues are unremarkable. Limited intracranial: No significant or unexpected finding. IMPRESSION: 1. Left frontal scalp hematoma. 2. No acute facial bone fractures. Electronically Signed   By: Sharlet Salina M.D.   On: 01/22/2021 19:12    Procedures Procedures   Medications Ordered in ED Medications  bacitracin ointment (1 application Topical Given 01/23/21 0046)  Tdap (BOOSTRIX) injection 0.5 mL (0.5 mLs Intramuscular Given 01/23/21 0046)    ED Course  I have reviewed the triage vital signs and the nursing notes.  Pertinent  imaging results that were available during my care of the patient were reviewed by me and considered in my medical decision making (see chart for details).    MDM Rules/Calculators/A&P                          All imaging is negative.  Patient reports feeling improved.  She declines any walker or crutches for her right knee pain.  Declines any imaging for her right great toe.  Discussed wound care.  Advise follow-up with Ortho next week if no improvement in her right knee pain.  Advise rest, ice, elevation.  Final Clinical Impression(s) / ED Diagnoses Final diagnoses:  Fall, initial encounter  Concussion without loss of consciousness, initial encounter  Abrasion of face, initial encounter  Sprain of right knee, unspecified ligament, initial encounter  Sprain of right great toe, initial encounter    Rx / DC Orders ED Discharge Orders    None       Zadie Rhine, MD 01/23/21 0147

## 2021-04-07 ENCOUNTER — Ambulatory Visit: Payer: Medicare PPO | Admitting: Podiatry

## 2021-04-07 ENCOUNTER — Other Ambulatory Visit: Payer: Self-pay

## 2021-04-07 DIAGNOSIS — B351 Tinea unguium: Secondary | ICD-10-CM | POA: Diagnosis not present

## 2021-04-07 DIAGNOSIS — M6740 Ganglion, unspecified site: Secondary | ICD-10-CM | POA: Diagnosis not present

## 2021-04-07 DIAGNOSIS — L603 Nail dystrophy: Secondary | ICD-10-CM

## 2021-04-07 DIAGNOSIS — M79676 Pain in unspecified toe(s): Secondary | ICD-10-CM

## 2021-04-07 NOTE — Progress Notes (Signed)
HPI: 75 y.o. female presenting today as a reestablish new patient for evaluation of pain and sensitivity to the right great toe nail plate.  Patient states that she injured her toenail and it partially detached from the underlying nailbed.  It is very painful and sensitive.  She would like to have it evaluated and treated  Also the patient states that she has had fungal toenails to the bilateral great toes for several years.  She would not like to have any oral antifungal treatment medication but would like topical.  She is requesting a topical antifungal to apply to the nail plates  Finally the patient states that she possibly has a corn to the right second toe.  It has been enlarged and symptomatic.  She denies a history of injury.  She presents for further treatment and evaluation  Past Medical History:  Diagnosis Date   Hypercholesteremia    Hypertension    JVD (jugular venous distension)    being worked up at [facility] for JVD with neck and shoulder pain   Osteoporosis      Physical Exam: General: The patient is alert and oriented x3 in no acute distress.  Dermatology: Skin is warm, dry and supple bilateral lower extremities. Negative for open lesions or macerations.  Hyperkeratotic dystrophic nails noted to the bilateral great toenails with associated tenderness to palpation.  The right hallux nail plate does appear partially detached to the underlying nailbed.  It is attached at the base of the nail.  Vascular: Palpable pedal pulses bilaterally. No edema or erythema noted. Capillary refill within normal limits.  Neurological: Epicritic and protective threshold grossly intact bilaterally.   Musculoskeletal Exam: Range of motion within normal limits to all pedal and ankle joints bilateral. Muscle strength 5/5 in all groups bilateral.  Fluctuant lesion noted overlying the DIPJ of the right second toe consistent with findings of a mucoid cyst   Assessment: 1.  Pain due to onychomycosis  of toenails bilateral halluces 2.  Partially detached painful nail plate right hallux secondary to trauma 3.  Mucoid cyst right second toe   Plan of Care:  1. Patient evaluated.  2.  Today we discussed different treatment options specifically regarding the partially detached toenail and mucoid cyst.  The patient would like to have the partially detached toenail completely removed and avulsed to allow the new nail to grow.  I agree.  The procedure was explained in detail.  The toe was prepped in aseptic manner and 3 mL of 2% lidocaine plain was infiltrated in a digital block fashion.  The nail was avulsed in entirety and light dressing applied. 3.  Also the decision was made to drain the mucoid cyst.  Prior to draining the cyst the toe was prepped in aseptic manner and 1 mL of 2% lidocaine plain was infiltrated in the right second toe.  The mucoid cyst lesion was lanced and viscous fluid was drained findings consistent with a mucoid cyst.  Light dressing applied.  We did discuss the possibility of recurrence 4.  Mechanical debridement of the left hallux nail plate was performed using a nail nipper without incident or bleeding 5.  OTC Tolcylen antifungal topical was provided for the patient at checkout to applied to the toenails daily 6.  Return to clinic as needed       Felecia Shelling, DPM [facility]  Dr. Felecia Shelling, DPM    [address]  [place], [place] [address]                Office [phone]  Fax [phone]

## 2022-12-01 IMAGING — CT CT CERVICAL SPINE W/O CM
3 of 4 series · 12 of 33 positions shown, 14 images · non-contrast
Comparison: None.

CLINICAL DATA: Neck trauma, fell

EXAM:
CT CERVICAL SPINE WITHOUT CONTRAST
TECHNIQUE: Multidetector CT imaging of the cervical spine was performed without
intravenous contrast. Multiplanar CT image reconstructions were also
generated.

[Series 1: c_spine 2.0 st · axial · 0.28mm/px · z∈[-298,-162]mm · 4 of 102 slices shown, 5 images]
[im 17/102  soft-tissue]
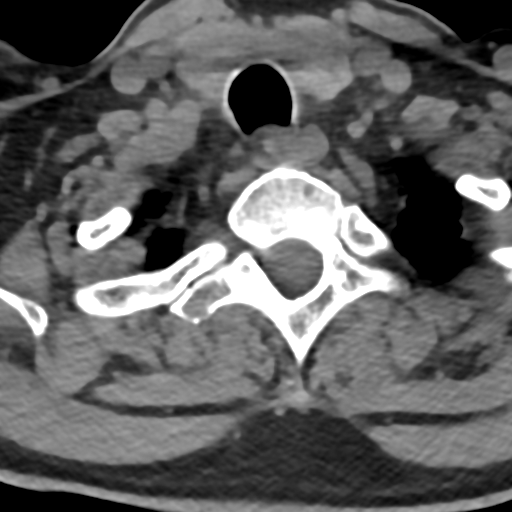
[im 17/102  bone]
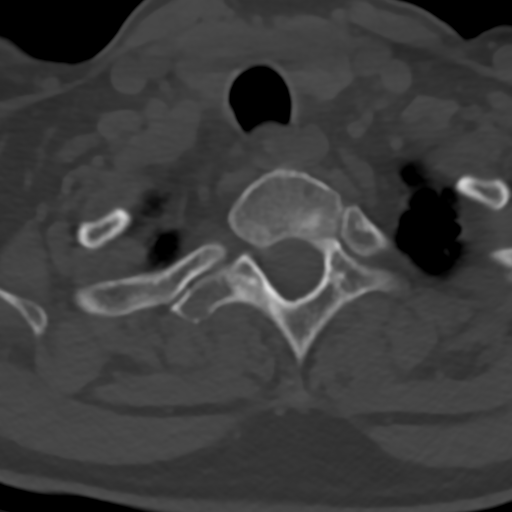
[im 34/102  bone]
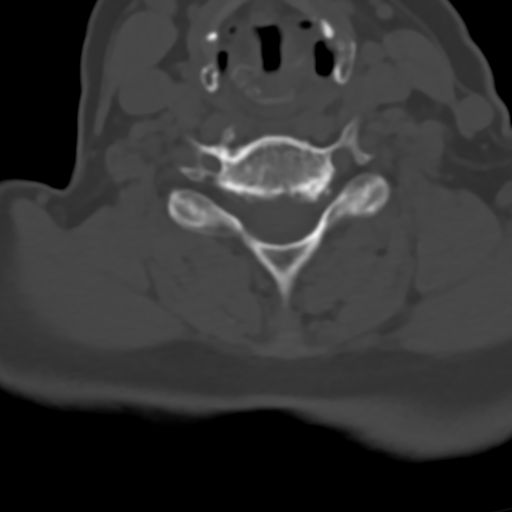
[im 68/102  bone]
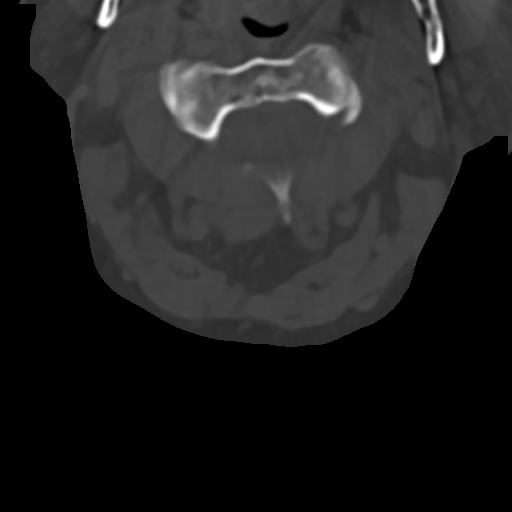
[im 85/102  bone]
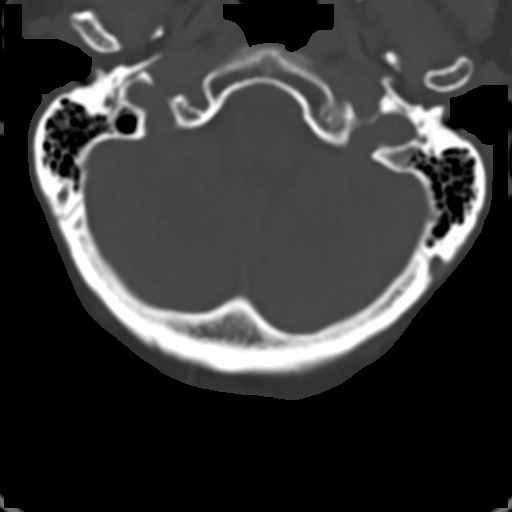

[Series 7: c_spine 2.0 sag bone · sagittal · 0.30mm/px · 5 of 61 slices shown, 6 images]
[im 21/61  bone]
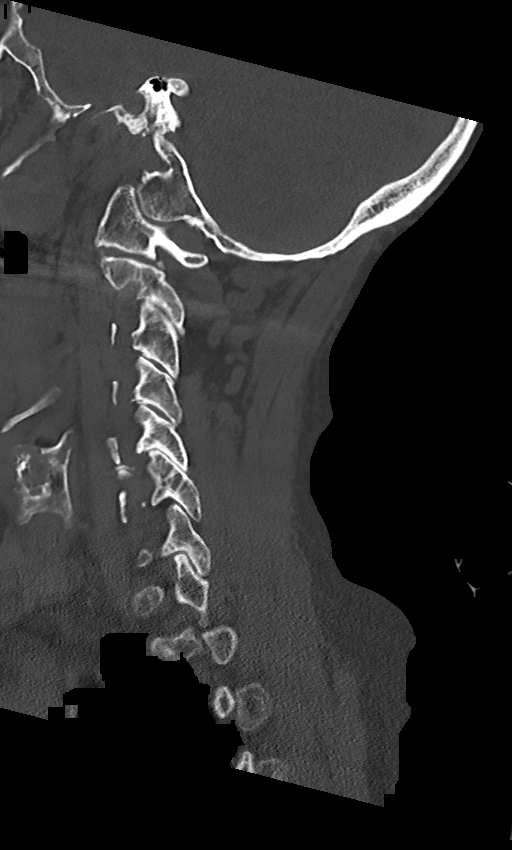
[im 26/61  bone]
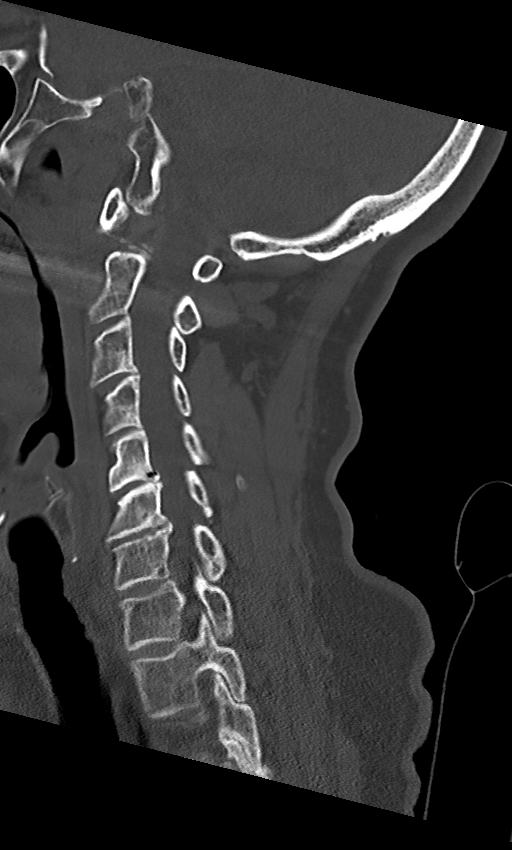
[im 31/61  soft-tissue]
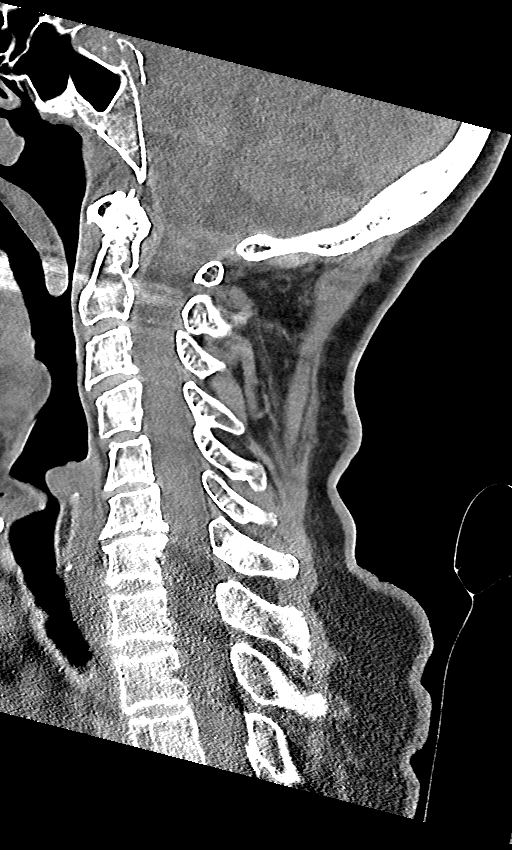
[im 31/61  bone]
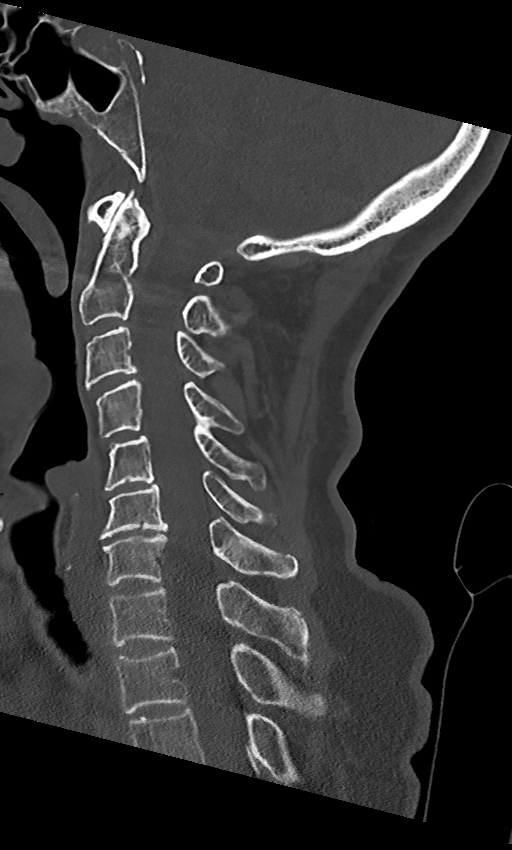
[im 36/61  bone]
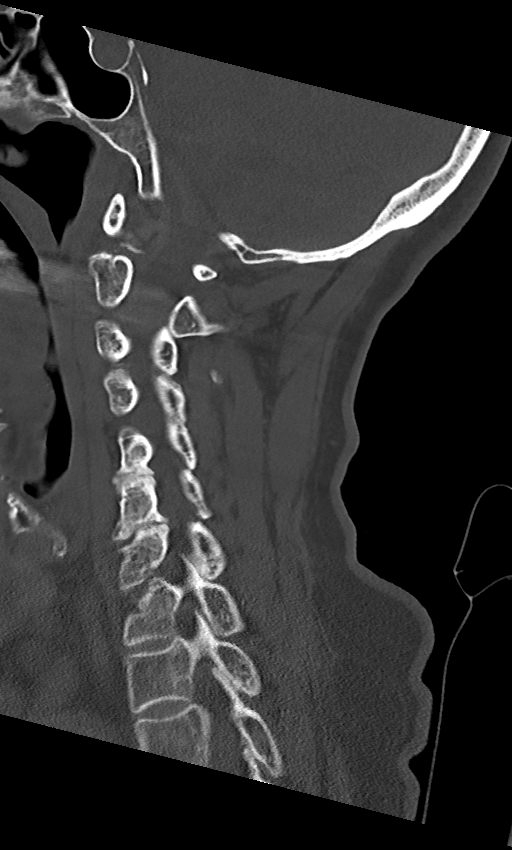
[im 41/61  bone]
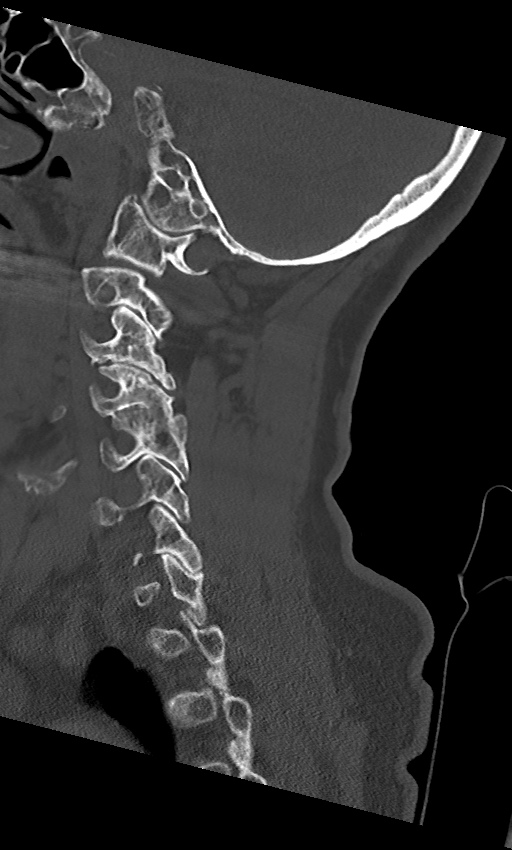

[Series 8: c_spine 2.0 cor bone · coronal · 0.30mm/px · 3 of 61 slices shown]
[im 13/61  bone]
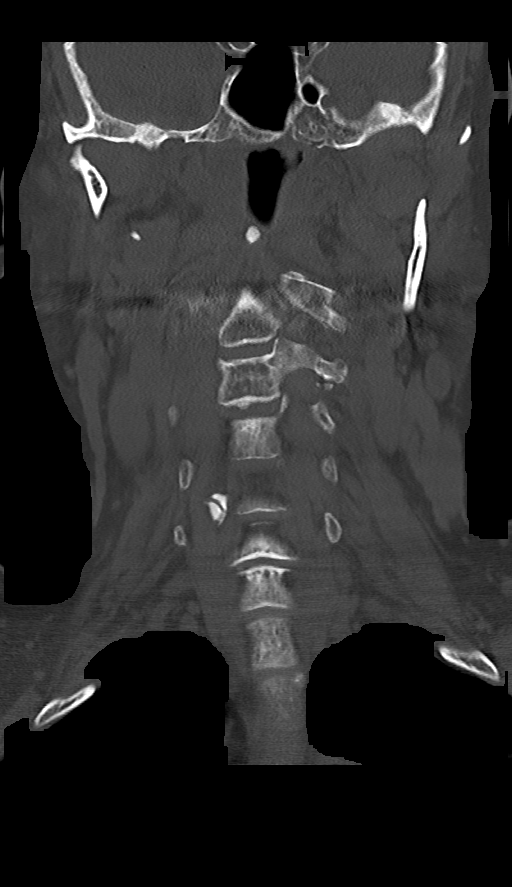
[im 25/61  bone]
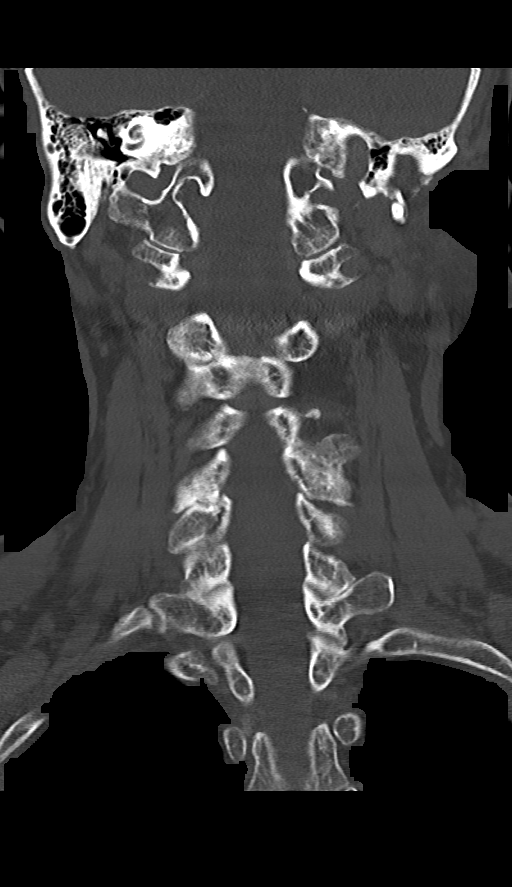
[im 37/61  bone]
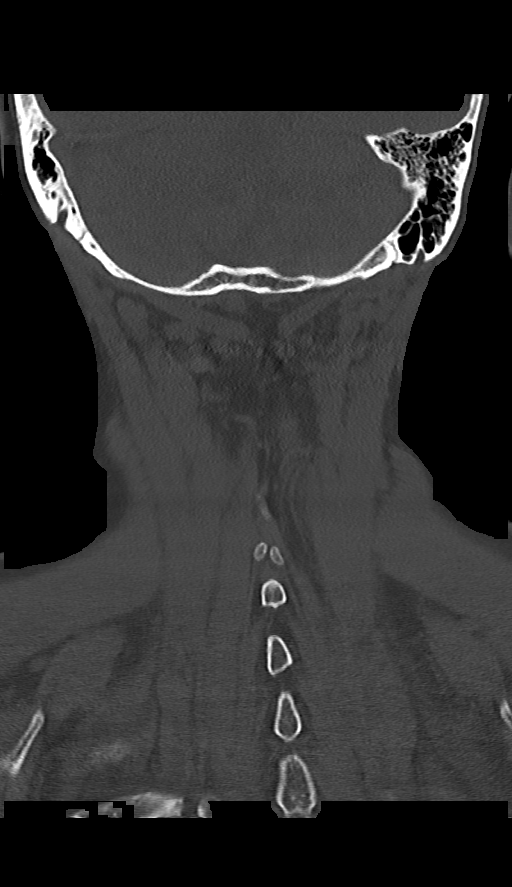

[12 of 33 positions shown; findings below may reference images not displayed]

FINDINGS: Alignment: Alignment is anatomic.

Skull base and vertebrae: No acute fracture. No primary bone lesion
or focal pathologic process.

Soft tissues and spinal canal: No prevertebral fluid or swelling. No
visible canal hematoma.

Disc levels: Multilevel cervical spondylosis most pronounced at
C3-4, C5-6, and C6-7. Multilevel facet hypertrophy, greatest at C3-4
and C4-5 eccentric to the left. There is bony fusion across the left
C4-5 facet.

Upper chest: Airway is patent.  Lung apices are clear.

Other: Reconstructed images demonstrate no additional findings.
IMPRESSION: 1. No acute cervical spine fracture.
2. Multilevel cervical degenerative changes as above.

## 2022-12-01 IMAGING — CT CT HEAD W/O CM
4 series · 16 of 47 positions shown, 18 images · non-contrast
Comparison: None.

CLINICAL DATA: Fell, facial trauma

EXAM:
CT HEAD WITHOUT CONTRAST
TECHNIQUE: Contiguous axial images were obtained from the base of the skull
through the vertex without intravenous contrast.

[Series 1: head without · axial · non-contrast · 0.48mm/px · z∈[-141,-11]mm · 7 of 36 slices shown, 9 images]
[im 5/36  brain]
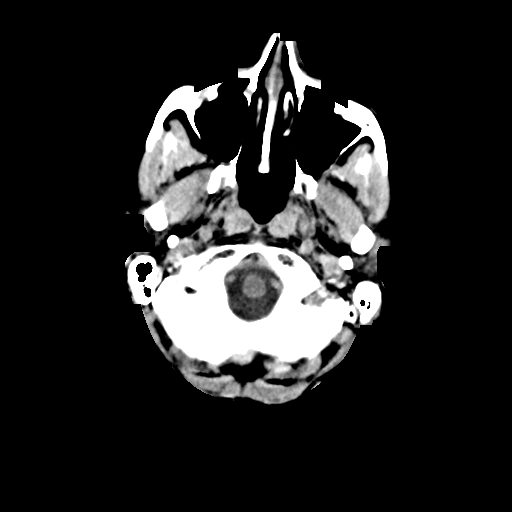
[im 5/36  bone]
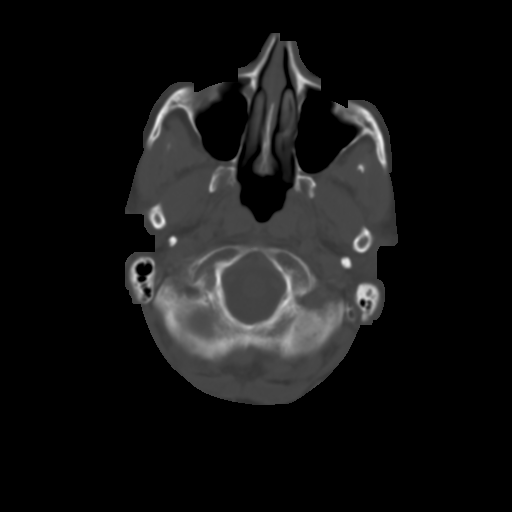
[im 9/36  brain]
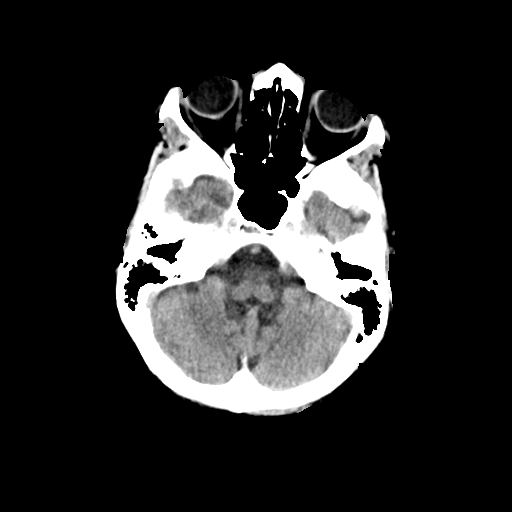
[im 14/36  brain]
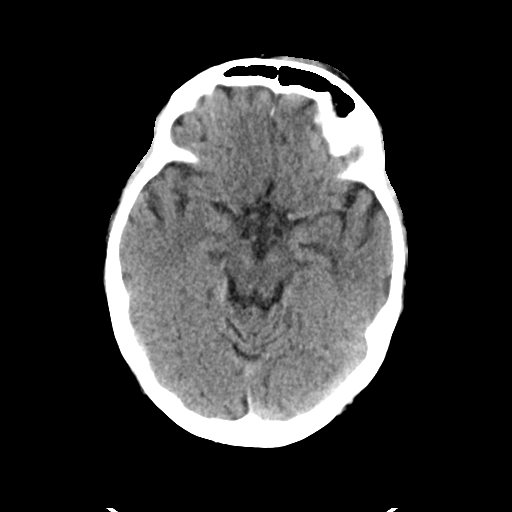
[im 18/36  brain]
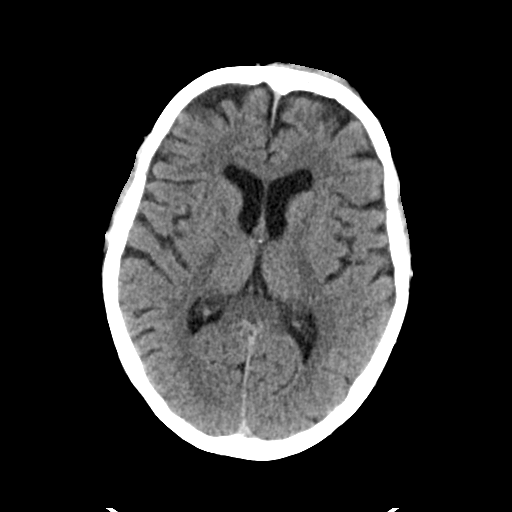
[im 22/36  brain]
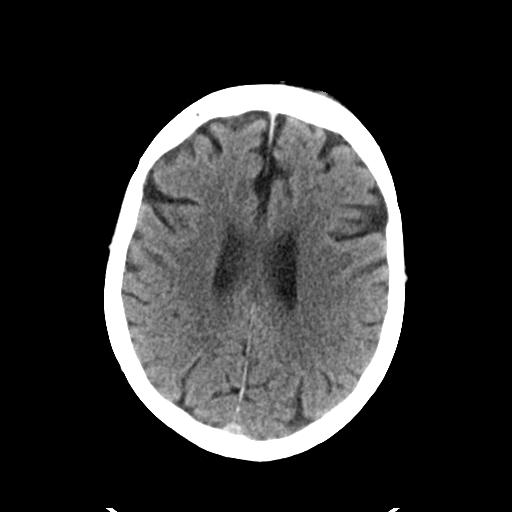
[im 22/36  bone]
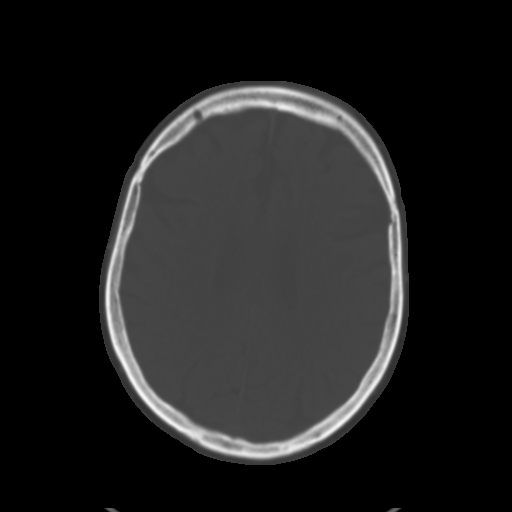
[im 27/36  brain]
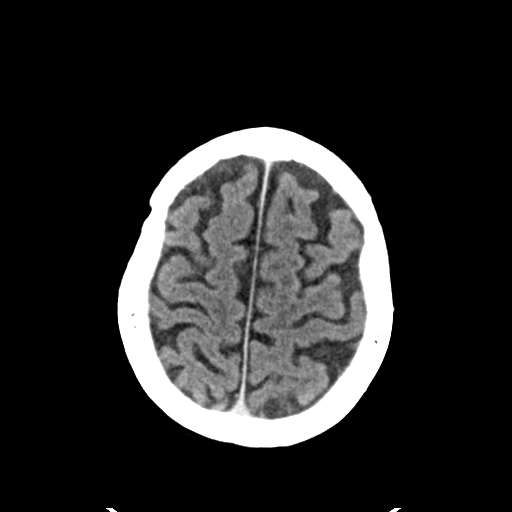
[im 31/36  brain]
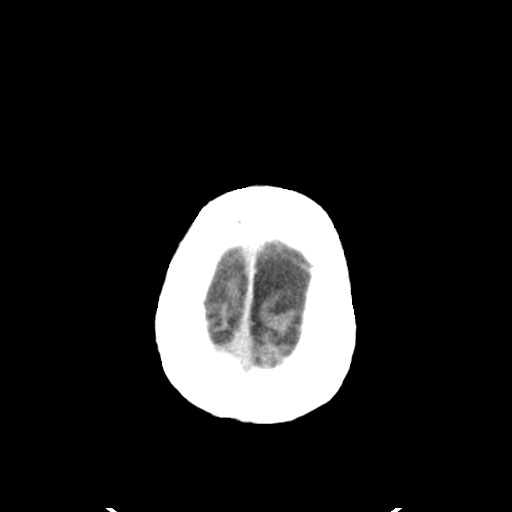

[Series 4: head bone · axial · 0.48mm/px · z∈[-145,-109]mm · 3 of 90 slices shown]
[im 9/90  bone]
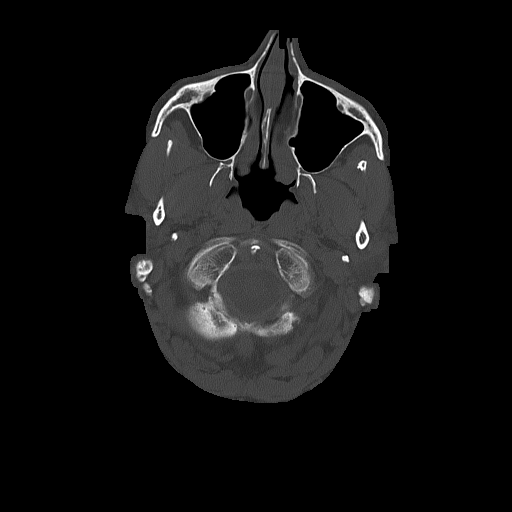
[im 18/90  bone]
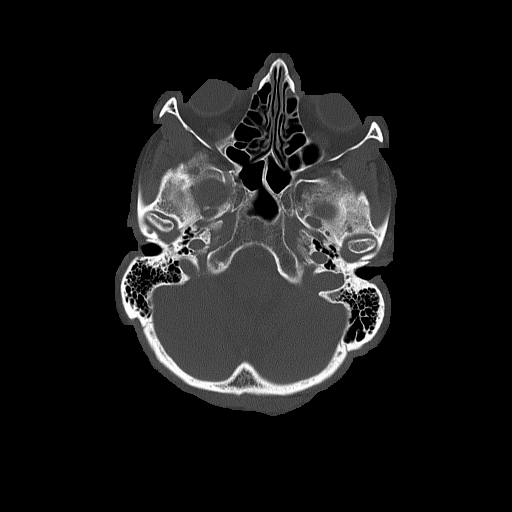
[im 27/90  bone]
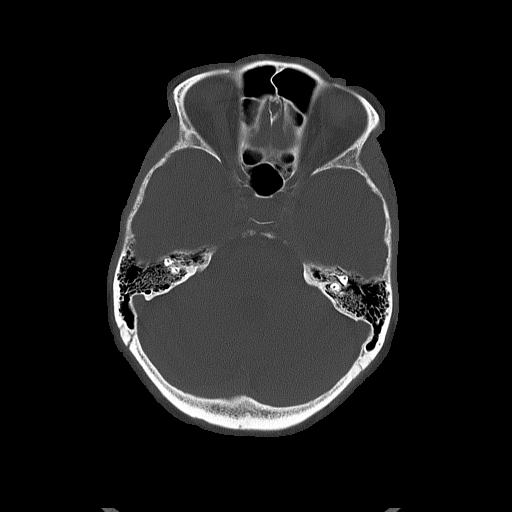

[Series 5: head without cor · coronal · non-contrast · 0.35mm/px · 3 of 74 slices shown]
[im 25/74  brain]
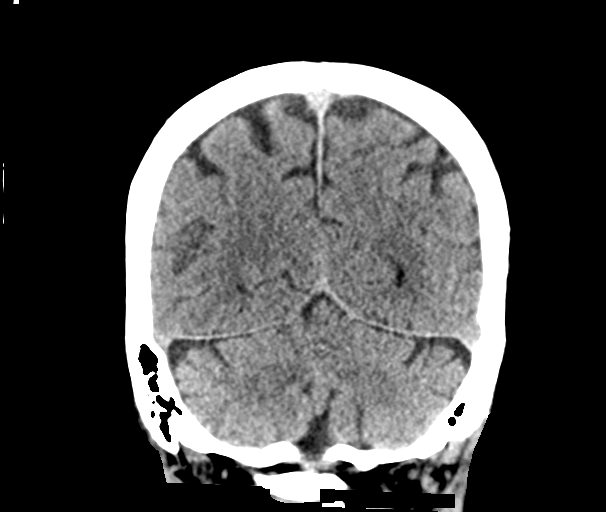
[im 33/74  brain]
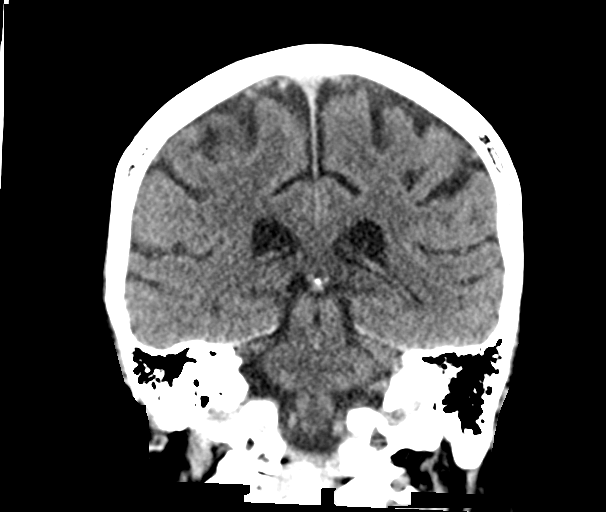
[im 41/74  brain]
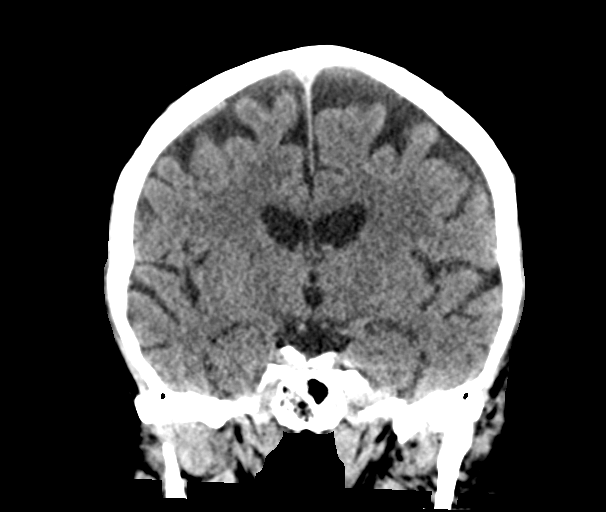

[Series 6: head without sag · sagittal · non-contrast · 0.35mm/px · 3 of 67 slices shown]
[im 23/67  brain]
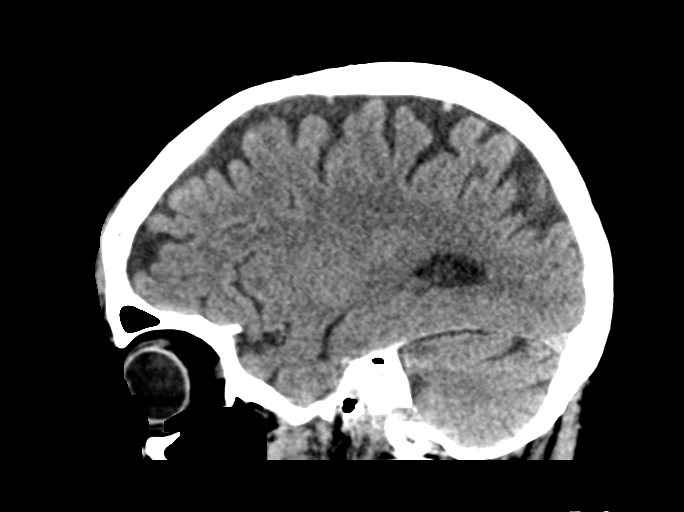
[im 34/67  brain]
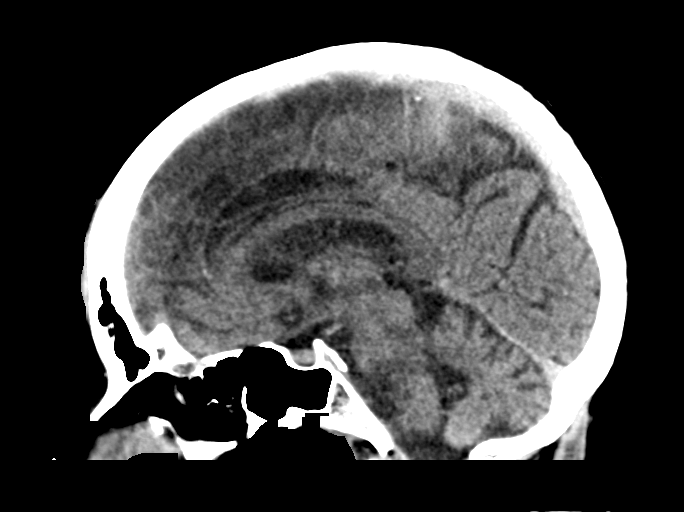
[im 45/67  brain]
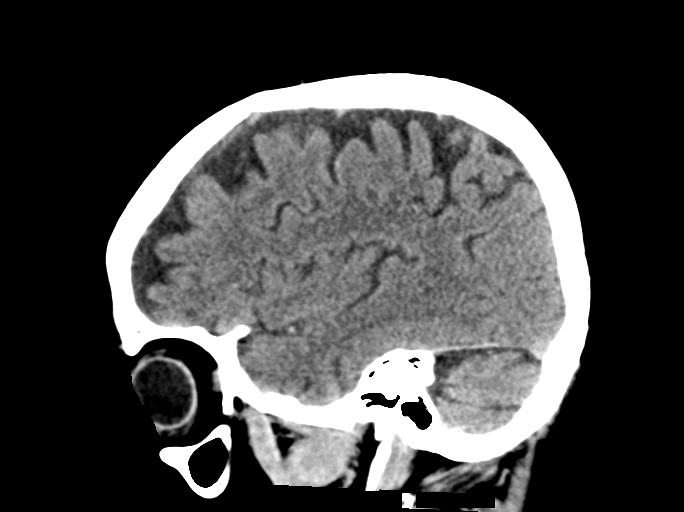

[16 of 47 positions shown; findings below may reference images not displayed]

FINDINGS: Brain: No acute infarct or hemorrhage. Lateral ventricles and
midline structures are unremarkable. No acute extra-axial fluid
collections. No mass effect.

Vascular: No hyperdense vessel or unexpected calcification.

Skull: Small left frontal scalp hematoma. No underlying fracture.
The remainder of the calvarium is unremarkable.

Sinuses/Orbits: No acute finding.

Other: None.
IMPRESSION: 1. Left frontal scalp hematoma.  No underlying fracture.
2. No acute intracranial process.

## 2023-05-07 ENCOUNTER — Telehealth: Payer: Self-pay | Admitting: Gastroenterology

## 2023-05-07 NOTE — Telephone Encounter (Signed)
Hi Dr. Chales Abrahams,   Supervising Provider 05/07/2023 AM   We received a referral for patient to have a colonoscopy. Patient last had on with [facility] in 2019. Patient is requesting to transfer of care due to not having a care partner that can travel to [facility] with her. Patient would like to be scheduled locally and would be able to have someone to be her care partner. Please review and advise on scheduling.    Thank you.

## 2023-05-11 NOTE — Telephone Encounter (Signed)
Needs OV- APP clinic or mine May not need rpt colon d/t age RG

## 2023-05-13 NOTE — Telephone Encounter (Signed)
Called patient to schedule office visit. Left voicemail.

## 2023-05-14 ENCOUNTER — Encounter: Payer: Self-pay | Admitting: Gastroenterology

## 2023-07-30 ENCOUNTER — Ambulatory Visit: Payer: Medicare PPO | Admitting: Gastroenterology

## 2023-07-30 ENCOUNTER — Encounter: Payer: Self-pay | Admitting: Gastroenterology

## 2023-07-30 VITALS — BP 130/68 | HR 70 | Ht 67.0 in | Wt 167.0 lb

## 2023-07-30 DIAGNOSIS — Z860100 Personal history of colon polyps, unspecified: Secondary | ICD-10-CM | POA: Insufficient documentation

## 2023-07-30 MED ORDER — NA SULFATE-K SULFATE-MG SULF 17.5-3.13-1.6 GM/177ML PO SOLN: 1.0000 | Freq: Once | ORAL | 0 refills | Status: AC

## 2023-07-30 NOTE — Progress Notes (Signed)
07/30/2023 Kendra Blackburn [id number] 08-12-1946   HISTORY OF PRESENT ILLNESS: This is a pleasant 77 year old female who is new to our office.  She has been referred here by Dr. Tawanna Cooler to discuss colonoscopy.  She has had several colonoscopies over the years.  Her last colonoscopy was at [facility] in February 2019 at which time she had a single polyp removed.  It was a tubular adenoma and they reported it was 1 cm in size.  They recommended a repeat in 5 years.  She denies any GI complaints.  She moves her bowels well, no rectal bleeding.  She has very limited PMH.  Past Medical History:  Diagnosis Date   Elevated cholesterol    Hypercholesteremia    Hypertension    JVD (jugular venous distension)    being worked up at [facility] for JVD with neck and shoulder pain   Osteoporosis    Past Surgical History:  Procedure Laterality Date   mitral valve proplapse     TUBAL LIGATION      reports that she has never smoked. She has never used smokeless tobacco. She reports current alcohol use. She reports that she does not use drugs. family history includes Bladder Cancer in her mother; Breast cancer in her mother; Diabetes in her mother; Heart disease in her father. No Known Allergies    Outpatient Encounter Medications as of 07/30/2023  Medication Sig   aspirin EC 81 MG tablet Take 81 mg by mouth daily.   atorvastatin (LIPITOR) 10 MG tablet Take 10 mg by mouth daily.   atorvastatin (LIPITOR) 10 MG tablet Take 20 mg by mouth daily.   CALCIUM-VITAMIN D PO Take by mouth.   desonide (DESOWEN) 0.05 % cream Apply to ears twice a day   erythromycin ophthalmic ointment Apply to eye.   lisinopril (ZESTRIL) 20 MG tablet Take 20 mg by mouth daily.   Multiple Vitamin (MULTIVITAMIN WITH MINERALS) TABS Take 1 tablet by mouth daily.   [DISCONTINUED] HYDROcodone-acetaminophen (NORCO/VICODIN) 5-325 MG tablet Take 1 tablet by mouth every 6 (six) hours as needed for moderate pain.   [DISCONTINUED] ALPRAZolam  (XANAX) 0.25 MG tablet Take 1 tablet (0.25 mg total) by mouth 2 (two) times daily as needed for anxiety.   [DISCONTINUED] fish oil-omega-3 fatty acids 1000 MG capsule Take 1 g by mouth daily.   [DISCONTINUED] lisinopril-hydrochlorothiazide (PRINZIDE,ZESTORETIC) 20-12.5 MG per tablet Take 1 tablet by mouth daily.   [DISCONTINUED] tacrolimus (PROTOPIC) 0.1 % ointment Apply 1 application topically 2 (two) times a week.   No facility-administered encounter medications on file as of 07/30/2023.    REVIEW OF SYSTEMS  : All other systems reviewed and negative except where noted in the History of Present Illness.   PHYSICAL EXAM: BP 130/68   Pulse 70   Ht 5\' 7"  (1.702 m)   Wt 167 lb (75.8 kg)   BMI 26.16 kg/m  General: Well developed white female in no acute distress Head: Normocephalic and atraumatic Eyes:  Sclerae anicteric, conjunctiva pink. Ears: Normal auditory acuity Lungs: Clear throughout to auscultation; no W/R/R. Heart: Regular rate and rhythm; no M/R/G. Rectal:  Will be done at the time of colonoscopy. Musculoskeletal: Symmetrical with no gross deformities  Skin: No lesions on visible extremities Neurological: Alert oriented x 4, grossly non-focal. Psychological:  Alert and cooperative. Normal mood and affect  ASSESSMENT AND PLAN: *Personal history of colon polyps:  Last colonoscopy was at [facility] in 10/2017 at which time she had a 1 cm tubular adenoma  removed.  Will plan for repeat colonoscopy with Dr. Chales Abrahams.  The risks, benefits, and alternatives to colonoscopy were discussed with the patient and he consents to proceed.   CC:  Shawn Stall, MD

## 2023-07-30 NOTE — Patient Instructions (Signed)
You have been scheduled for a colonoscopy. Please follow written instructions given to you at your visit today.   Please pick up your prep supplies at the pharmacy within the next 1-3 days.  If you use inhalers (even only as needed), please bring them with you on the day of your procedure.  DO NOT TAKE 7 DAYS PRIOR TO TEST- Trulicity (dulaglutide) Ozempic, Wegovy (semaglutide) Mounjaro (tirzepatide) Bydureon Bcise (exanatide extended release)  DO NOT TAKE 1 DAY PRIOR TO YOUR TEST Rybelsus (semaglutide) Adlyxin (lixisenatide) Victoza (liraglutide) Byetta (exanatide) _______________________________________________________________________  _______________________________________________________  If your blood pressure at your visit was 140/90 or greater, please contact your primary care physician to follow up on this.  _______________________________________________________  If you are age 79 or older, your body mass index should be between 23-30. Your Body mass index is 26.16 kg/m. If this is out of the aforementioned range listed, please consider follow up with your Primary Care Provider.  If you are age 21 or younger, your body mass index should be between 19-25. Your Body mass index is 26.16 kg/m. If this is out of the aformentioned range listed, please consider follow up with your Primary Care Provider.   ________________________________________________________  The [facility] providers would like to encourage you to use [facility] to communicate with providers for non-urgent requests or questions.  Due to long hold times on the telephone, sending your provider a message by [facility] may be a faster and more efficient way to get a response.  Please allow 48 business hours for a response.  Please remember that this is for non-urgent requests.  _______________________________________________________

## 2023-08-07 NOTE — Progress Notes (Signed)
Agree with assessment/plan.  Raj Florestine Carmical, MD [facility] [phone]  

## 2023-10-26 ENCOUNTER — Telehealth: Payer: Self-pay | Admitting: Gastroenterology

## 2023-10-26 NOTE — Telephone Encounter (Signed)
Inbound call from patient's insurance stating PA is needed for 2.12 colonoscopy. Please advise, thank you.

## 2023-10-27 ENCOUNTER — Ambulatory Visit: Payer: Medicare PPO

## 2023-10-27 ENCOUNTER — Encounter: Payer: Self-pay | Admitting: Gastroenterology

## 2023-10-27 VITALS — Ht 67.0 in | Wt 167.0 lb

## 2023-10-27 DIAGNOSIS — Z860100 Personal history of colon polyps, unspecified: Secondary | ICD-10-CM

## 2023-10-27 NOTE — Progress Notes (Signed)

## 2023-11-03 ENCOUNTER — Ambulatory Visit (AMBULATORY_SURGERY_CENTER): Payer: Medicare PPO | Admitting: Gastroenterology

## 2023-11-03 ENCOUNTER — Encounter: Payer: Self-pay | Admitting: Gastroenterology

## 2023-11-03 VITALS — BP 121/71 | HR 53 | Temp 98.6°F | Resp 15 | Ht 67.0 in | Wt 167.0 lb

## 2023-11-03 DIAGNOSIS — Z860101 Personal history of adenomatous and serrated colon polyps: Secondary | ICD-10-CM | POA: Diagnosis not present

## 2023-11-03 DIAGNOSIS — Z1211 Encounter for screening for malignant neoplasm of colon: Secondary | ICD-10-CM

## 2023-11-03 DIAGNOSIS — K640 First degree hemorrhoids: Secondary | ICD-10-CM

## 2023-11-03 DIAGNOSIS — Z860100 Personal history of colon polyps, unspecified: Secondary | ICD-10-CM

## 2023-11-03 DIAGNOSIS — K5730 Diverticulosis of large intestine without perforation or abscess without bleeding: Secondary | ICD-10-CM | POA: Diagnosis not present

## 2023-11-03 MED ORDER — SODIUM CHLORIDE 0.9 % IV SOLN: 500.0000 mL | Freq: Once | INTRAVENOUS | Status: DC

## 2023-11-03 NOTE — Progress Notes (Signed)
Pt A/O x 3, gd SR's, pleased with anesthesia, report to RN

## 2023-11-03 NOTE — Op Note (Signed)
[facility] Patient Name: Kendra Blackburn Procedure Date: 11/03/2023 7:54 AM MRN: [id number] Endoscopist: Lynann Bologna , MD, [id number] Age: 78 Referring MD:  Date of Birth: [birth year] Gender: Female Account #: [id number] Procedure:                Colonoscopy Indications:              High risk colon cancer surveillance: Personal                            history of colonic polyps 2019 Medicines:                Monitored Anesthesia Care Procedure:                Pre-Anesthesia Assessment:                           - Prior to the procedure, a History and Physical                            was performed, and patient medications and                            allergies were reviewed. The patient's tolerance of                            previous anesthesia was also reviewed. The risks                            and benefits of the procedure and the sedation                            options and risks were discussed with the patient.                            All questions were answered, and informed consent                            was obtained. Prior Anticoagulants: The patient has                            taken no anticoagulant or antiplatelet agents. ASA                            Grade Assessment: II - A patient with mild systemic                            disease. After reviewing the risks and benefits,                            the patient was deemed in satisfactory condition to                            undergo the procedure.  After obtaining informed consent, the colonoscope                            was passed under direct vision. Throughout the                            procedure, the patient's blood pressure, pulse, and                            oxygen saturations were monitored continuously. The                            PCF-HQ190L Colonoscope [id number] was introduced                            through the anus and advanced to  the 2 cm into the                            ileum. The colonoscopy was performed without                            difficulty. The patient tolerated the procedure                            well. The quality of the bowel preparation was                            good. The terminal ileum, ileocecal valve,                            appendiceal orifice, and rectum were photographed. Scope In: 8:01:31 AM Scope Out: 8:17:06 AM Scope Withdrawal Time: 0 hours 10 minutes 49 seconds  Total Procedure Duration: 0 hours 15 minutes 35 seconds  Findings:                 Multiple medium-mouthed diverticula were found in                            the sigmoid colon, few in descending colon and                            ascending colon.                           Non-bleeding internal hemorrhoids were found during                            retroflexion. The hemorrhoids were small and Grade                            I (internal hemorrhoids that do not prolapse).                           The terminal ileum appeared normal.  The exam was otherwise without abnormality on                            direct and retroflexion views. Complications:            No immediate complications. Estimated Blood Loss:     Estimated blood loss: none. Impression:               - Diverticulosis in the sigmoid colon, few in the                            descending colon and in the ascending colon.                           - Non-bleeding internal hemorrhoids.                           - The examined portion of the ileum was normal.                           - The examination was otherwise normal on direct                            and retroflexion views.                           - No specimens collected. Recommendation:           - Patient has a contact number available for                            emergencies. The signs and symptoms of potential                            delayed  complications were discussed with the                            patient. Return to normal activities tomorrow.                            Written discharge instructions were provided to the                            patient.                           - High fiber diet.                           - Continue present medications.                           - Repeat colonoscopy is not recommended for                            surveillance.                           -  The findings and recommendations were discussed                            with the patient's family. Lynann Bologna, MD 11/03/2023 8:21:22 AM This report has been signed electronically.

## 2023-11-03 NOTE — Progress Notes (Signed)
07/30/2023 Kendra Blackburn [id number] 1946/01/21     HISTORY OF PRESENT ILLNESS: This is a pleasant 78 year old female who is new to our office.  She has been referred here by Dr. Tawanna Cooler to discuss colonoscopy.  She has had several colonoscopies over the years.  Her last colonoscopy was at [facility] in February 2019 at which time she had a single polyp removed.  It was a tubular adenoma and they reported it was 1 cm in size.  They recommended a repeat in 5 years.  She denies any GI complaints.  She moves her bowels well, no rectal bleeding.  She has very limited PMH.       Past Medical History:  Diagnosis Date   Elevated cholesterol     Hypercholesteremia     Hypertension     JVD (jugular venous distension)      being worked up at [facility] for JVD with neck and shoulder pain   Osteoporosis               Past Surgical History:  Procedure Laterality Date   mitral valve proplapse       TUBAL LIGATION             reports that she has never smoked. She has never used smokeless tobacco. She reports current alcohol use. She reports that she does not use drugs. family history includes Bladder Cancer in her mother; Breast cancer in her mother; Diabetes in her mother; Heart disease in her father. Allergies  No Known Allergies           Outpatient Encounter Medications as of 07/30/2023  Medication Sig   aspirin EC 81 MG tablet Take 81 mg by mouth daily.   atorvastatin (LIPITOR) 10 MG tablet Take 10 mg by mouth daily.   atorvastatin (LIPITOR) 10 MG tablet Take 20 mg by mouth daily.   CALCIUM-VITAMIN D PO Take by mouth.   desonide (DESOWEN) 0.05 % cream Apply to ears twice a day   erythromycin ophthalmic ointment Apply to eye.   lisinopril (ZESTRIL) 20 MG tablet Take 20 mg by mouth daily.   Multiple Vitamin (MULTIVITAMIN WITH MINERALS) TABS Take 1 tablet by mouth daily.   [DISCONTINUED] HYDROcodone-acetaminophen (NORCO/VICODIN) 5-325 MG tablet Take 1 tablet by mouth every 6 (six) hours as  needed for moderate pain.   [DISCONTINUED] ALPRAZolam (XANAX) 0.25 MG tablet Take 1 tablet (0.25 mg total) by mouth 2 (two) times daily as needed for anxiety.   [DISCONTINUED] fish oil-omega-3 fatty acids 1000 MG capsule Take 1 g by mouth daily.   [DISCONTINUED] lisinopril-hydrochlorothiazide (PRINZIDE,ZESTORETIC) 20-12.5 MG per tablet Take 1 tablet by mouth daily.   [DISCONTINUED] tacrolimus (PROTOPIC) 0.1 % ointment Apply 1 application topically 2 (two) times a week.      No facility-administered encounter medications on file as of 07/30/2023.        REVIEW OF SYSTEMS  : All other systems reviewed and negative except where noted in the History of Present Illness.     PHYSICAL EXAM: BP 130/68   Pulse 70   Ht 5\' 7"  (1.702 m)   Wt 167 lb (75.8 kg)   BMI 26.16 kg/m  General: Well developed white female in no acute distress Head: Normocephalic and atraumatic Eyes:  Sclerae anicteric, conjunctiva pink. Ears: Normal auditory acuity Lungs: Clear throughout to auscultation; no W/R/R. Heart: Regular rate and rhythm; no M/R/G. Rectal:  Will be done at the time of colonoscopy. Musculoskeletal: Symmetrical with no gross  deformities  Skin: No lesions on visible extremities Neurological: Alert oriented x 4, grossly non-focal. Psychological:  Alert and cooperative. Normal mood and affect   ASSESSMENT AND PLAN: *Personal history of colon polyps:  Last colonoscopy was at [facility] in 10/2017 at which time she had a 1 cm tubular adenoma removed.  Will plan for repeat colonoscopy with Dr. Chales Abrahams.  The risks, benefits, and alternatives to colonoscopy were discussed with the patient and he consents to proceed.     CC:  Shawn Stall, MD     Attending physician's note   I have taken history, reviewed the chart and examined the patient. I performed a substantive portion of this encounter, including complete performance of at least one of the key components, in conjunction with the APP. I agree with the  Advanced Practitioner's note, impression and recommendations.    Edman Circle, MD [facility] [phone]

## 2023-11-03 NOTE — Progress Notes (Signed)
[facility]  Pt's states no medical or surgical changes since previsit or office visit.

## 2023-11-03 NOTE — Patient Instructions (Signed)
Thank you for letting us take care of your healthcare needs today. Please see handouts given to you on Diverticulosis and Hemorrhoids.    YOU HAD AN ENDOSCOPIC PROCEDURE TODAY AT THE [facility]:   Refer to the procedure report that was given to you for any specific questions about what was found during the examination.  If the procedure report does not answer your questions, please call your gastroenterologist to clarify.  If you requested that your care partner not be given the details of your procedure findings, then the procedure report has been included in a sealed envelope for you to review at your convenience later.  YOU SHOULD EXPECT: Some feelings of bloating in the abdomen. Passage of more gas than usual.  Walking can help get rid of the air that was put into your GI tract during the procedure and reduce the bloating. If you had a lower endoscopy (such as a colonoscopy or flexible sigmoidoscopy) you may notice spotting of blood in your stool or on the toilet paper. If you underwent a bowel prep for your procedure, you may not have a normal bowel movement for a few days.  Please Note:  You might notice some irritation and congestion in your nose or some drainage.  This is from the oxygen used during your procedure.  There is no need for concern and it should clear up in a day or so.  SYMPTOMS TO REPORT IMMEDIATELY:  Following lower endoscopy (colonoscopy or flexible sigmoidoscopy):  Excessive amounts of blood in the stool  Significant tenderness or worsening of abdominal pains  Swelling of the abdomen that is new, acute  Fever of 100F or higher   For urgent or emergent issues, a gastroenterologist can be reached at any hour by calling [phone]. Do not use MyChart messaging for urgent concerns.    DIET:  We do recommend a small meal at first, but then you may proceed to your regular diet.  Drink plenty of fluids but you should avoid alcoholic beverages for 24  hours.  ACTIVITY:  You should plan to take it easy for the rest of today and you should NOT DRIVE or use heavy machinery until tomorrow (because of the sedation medicines used during the test).    FOLLOW UP: Our staff will call the number listed on your records the next business day following your procedure.  We will call around 7:15- 8:00 am to check on you and address any questions or concerns that you may have regarding the information given to you following your procedure. If we do not reach you, we will leave a message.     If any biopsies were taken you will be contacted by phone or by letter within the next 1-3 weeks.  Please call us at [phone] if you have not heard about the biopsies in 3 weeks.    SIGNATURES/CONFIDENTIALITY: You and/or your care partner have signed paperwork which will be entered into your electronic medical record.  These signatures attest to the fact that that the information above on your After Visit Summary has been reviewed and is understood.  Full responsibility of the confidentiality of this discharge information lies with you and/or your care-partner.

## 2023-11-04 ENCOUNTER — Telehealth: Payer: Self-pay

## 2023-11-04 NOTE — Telephone Encounter (Signed)
Left message on follow up call.
